# Patient Record
Sex: Female | Born: 1985 | Race: White | Hispanic: No | Marital: Single | Smoking: Current every day smoker
Health system: Southern US, Community
[De-identification: ages and names within clinical notes are randomized; demographics above are authoritative.]

## PROBLEM LIST (undated history)

## (undated) DIAGNOSIS — Z8742 Personal history of other diseases of the female genital tract: Secondary | ICD-10-CM

## (undated) DIAGNOSIS — Z8709 Personal history of other diseases of the respiratory system: Secondary | ICD-10-CM

## (undated) DIAGNOSIS — F419 Anxiety disorder, unspecified: Secondary | ICD-10-CM

## (undated) DIAGNOSIS — R569 Unspecified convulsions: Secondary | ICD-10-CM

## (undated) DIAGNOSIS — Z8619 Personal history of other infectious and parasitic diseases: Secondary | ICD-10-CM

## (undated) DIAGNOSIS — F32A Depression, unspecified: Secondary | ICD-10-CM

## (undated) DIAGNOSIS — Z87440 Personal history of urinary (tract) infections: Secondary | ICD-10-CM

## (undated) HISTORY — PX: FLEXIBLE BRONCHOSCOPY W/ UPPER ENDOSCOPY: SHX1648

## (undated) HISTORY — DX: Personal history of other diseases of the respiratory system: Z87.09

## (undated) HISTORY — DX: Personal history of other infectious and parasitic diseases: Z86.19

## (undated) HISTORY — DX: Depression, unspecified: F32.A

## (undated) HISTORY — DX: Unspecified convulsions: R56.9

## (undated) HISTORY — DX: Personal history of urinary (tract) infections: Z87.440

## (undated) HISTORY — DX: Anxiety disorder, unspecified: F41.9

## (undated) HISTORY — DX: Personal history of other diseases of the female genital tract: Z87.42

## (undated) HISTORY — PX: INNER EAR SURGERY: SHX679

---
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
## 2006-04-25 ENCOUNTER — Observation Stay: Payer: Self-pay

## 2006-05-15 ENCOUNTER — Inpatient Hospital Stay: Payer: Self-pay

## 2008-02-21 ENCOUNTER — Emergency Department: Payer: Self-pay | Admitting: Emergency Medicine

## 2012-04-18 ENCOUNTER — Emergency Department: Payer: Self-pay | Admitting: Emergency Medicine

## 2012-04-18 LAB — DRUG SCREEN, URINE
Amphetamines, Ur Screen: NEGATIVE (ref ?–1000)
Barbiturates, Ur Screen: NEGATIVE (ref ?–200)
Cocaine Metabolite,Ur ~~LOC~~: NEGATIVE (ref ?–300)
MDMA (Ecstasy)Ur Screen: NEGATIVE (ref ?–500)
Methadone, Ur Screen: NEGATIVE (ref ?–300)
Opiate, Ur Screen: NEGATIVE (ref ?–300)
Phencyclidine (PCP) Ur S: NEGATIVE (ref ?–25)
Tricyclic, Ur Screen: NEGATIVE (ref ?–1000)

## 2012-04-18 LAB — COMPREHENSIVE METABOLIC PANEL
Alkaline Phosphatase: 92 U/L (ref 50–136)
Anion Gap: 12 (ref 7–16)
Bilirubin,Total: 0.2 mg/dL (ref 0.2–1.0)
Calcium, Total: 8.7 mg/dL (ref 8.5–10.1)
Co2: 21 mmol/L (ref 21–32)
Creatinine: 0.59 mg/dL — ABNORMAL LOW (ref 0.60–1.30)
EGFR (African American): >60
EGFR (Non-African Amer.): >60
Glucose: 88 mg/dL (ref 65–99)
Potassium: 4.3 mmol/L (ref 3.5–5.1)
Sodium: 146 mmol/L — ABNORMAL HIGH (ref 136–145)
Total Protein: 7.9 g/dL (ref 6.4–8.2)

## 2012-04-18 LAB — ETHANOL
Ethanol %: 0.177 % — ABNORMAL HIGH (ref 0.000–0.080)
Ethanol: 177 mg/dL

## 2012-04-18 LAB — CBC
HCT: 47.9 % — ABNORMAL HIGH (ref 35.0–47.0)
HGB: 16.3 g/dL — ABNORMAL HIGH (ref 12.0–16.0)
MCH: 30.7 pg (ref 26.0–34.0)
MCHC: 34 g/dL (ref 32.0–36.0)
RBC: 5.31 10*6/uL — ABNORMAL HIGH (ref 3.80–5.20)
RDW: 13.5 % (ref 11.5–14.5)
WBC: 8.5 10*3/uL (ref 3.6–11.0)

## 2012-04-18 LAB — SALICYLATE LEVEL: Salicylates, Serum: 3.7 mg/dL — ABNORMAL HIGH

## 2012-04-18 LAB — TSH: Thyroid Stimulating Horm: 1.08 u[IU]/mL

## 2012-04-18 LAB — URINALYSIS, COMPLETE
Bilirubin,UR: NEGATIVE
Blood: NEGATIVE
Glucose,UR: NEGATIVE mg/dL (ref 0–75)
Ketone: NEGATIVE
Leukocyte Esterase: NEGATIVE
Nitrite: NEGATIVE
Ph: 6 (ref 4.5–8.0)
RBC,UR: 1 /HPF (ref 0–5)
Squamous Epithelial: <1
WBC UR: <1 /HPF (ref 0–5)

## 2012-04-18 LAB — ACETAMINOPHEN LEVEL: Acetaminophen: <2 ug/mL

## 2012-04-18 LAB — PREGNANCY, URINE: Pregnancy Test, Urine: NEGATIVE m[IU]/mL

## 2013-05-21 ENCOUNTER — Ambulatory Visit: Payer: Self-pay | Admitting: Physician Assistant

## 2014-02-08 ENCOUNTER — Emergency Department: Payer: Self-pay | Admitting: Emergency Medicine

## 2014-02-08 LAB — URINALYSIS, COMPLETE
BILIRUBIN, UR: NEGATIVE
GLUCOSE, UR: NEGATIVE mg/dL (ref 0–75)
LEUKOCYTE ESTERASE: NEGATIVE
Nitrite: NEGATIVE
Ph: 5 (ref 4.5–8.0)
Protein: NEGATIVE
Specific Gravity: 1.019 (ref 1.003–1.030)
Squamous Epithelial: 6
WBC UR: 4 /HPF (ref 0–5)

## 2014-04-30 ENCOUNTER — Ambulatory Visit: Payer: Self-pay | Admitting: Physician Assistant

## 2014-05-04 ENCOUNTER — Ambulatory Visit: Payer: Self-pay | Admitting: Family Medicine

## 2014-05-18 ENCOUNTER — Ambulatory Visit: Payer: Self-pay | Admitting: Family Medicine

## 2014-07-06 NOTE — Consult Note (Signed)
Brief Consult Note: Diagnosis: Alcohol intoxication, alcohol dependence.   Patient was seen by consultant.   Consult note dictated.   Recommend further assessment or treatment.   Discussed with Attending MD.   Comments: [name] has a h/o alcohol dependence. Last night she got drunk at a bar during [employer] party. She made suicidal threats while drunk and preceeded to taka a bath at home still drinking. She passed out in a bathtub. Her friends took out papers.   She is no longer drunk, suicidal or homicidal. She declines substance abuse treatment and is determined not to drink again.   PLAN: 1. The patient no longer meets criteria for IVC. I will terminate proceedings. Please discharge as appropriate.   2. No medications recommended.   3. [facility].  Electronic Signatures: Kristine LineaPucilowska, Ethelreda Sukhu (MD)  (Signed 03-Feb-14 10:31)  Authored: Brief Consult Note   Last Updated: 03-Feb-14 10:31 by Kristine LineaPucilowska, Josphine Laffey (MD)

## 2014-07-06 NOTE — Consult Note (Signed)
PATIENT NAMMarland Kitchen:  Gardiner BarefootLLMOND, [name] [id number] OF BIRTH:  22-Aug-1985 OF ADMISSION: 02/03/2014OF CONSULTATION:  04/18/2012 PHYSICIAN:  Rabon Scholle B. Jennet MaduroPucilowska, MD REQUESTING PHYSICIAN: Dr. Janalyn Harderavid Kaminski FOR CONSULTATION: To evaluate a suicidal patient.  DATA: [name] is a [age] year old female with a history alcohol abuse.   COMPLAINT: "I am fine." OF PRESENT ILLNESS: [name] was brought to the hospital by the police after her friends took IVC papers out. Reportedly, the patient was at a bar celebrating [employer]. She started drinking liquor which is never a "good idea" for her as she gets "crazy". She made some suicidal statements, continued to drink at home and passed out in a bathtub frightening her friends. She is in the ER completely sober, embarrassed and ready to return to home. She denies any symptoms of depression, anxiety or psychosis. She is a drinker but feels she is OK as long as she does not touch the hard stuff.  She believes that she slowed down her drinking since DUI in the summer. She does not use drugs or pills.  PSYCHIATRIC HISTORY: She reports a period of depression after she lost her grandmother 2 years ago. She did not receive treatment then. She downplays her alcohol use. There were no suicide attempts.  PSYCHIATRIC HISTORY: None reported.   MEDICAL HISTORY: None.  ON ADMISSION: None.   Augmentin, Biaxin, Ceftin, tape.   HISTORY: The patient works at [employer]. She lives with her 28 year old son. Her mother is involved in caring for the child. As above she had a recent DUI but tells me that it has been taken care of.   REVIEW OF SYSTEMS: CONSTITUTIONAL: No fevers or chills. No weigh changes.  EYES:  No double or blurred vision. ENT:  No hearing loss. RESPIRATORY: No shortness of breath or cough. CARDIOVASCULAR:  No chest pain or orthopnea. GASTROINTESTINAL:  No abdominal pain, nausea, vomiting or diarrhea. GENITOURINARY:  No incontinence or frequency. ENDOCRINE:  No heat or cold  intolerance. LYMPHATIC: No anemia or easy bruising. INTEGUMENTARY:  No acne or rash. MUSCULOSKELETAL:  No muscle or joint pain. NEUROLOGIC:  No tingling or weakness. PSYCHIATRIC:  See history of present illness for details.  EXAMINATION: VITAL SIGNS:  Blood pressure 111/68, pulse 94, respirations 20, temperature 98.1. GENERAL: This is a well-developed female in no acute distress. The rest of the physical examination is deferred to her primary attending.  DATA: Chemistries are within normal limits. Blood alcohol level is 0.177. LFTs within normal limits. TSH 1.08. Urine tox screen negative for substances. CBC within normal limits. Urinalysis is not suggestive of urinary tract infection. Serum acetaminophen and salicylates are low.  STATUS EXAMINATION: The patient is alert and oriented to person, place, time and situation. She is pleasant, polite and cooperative. She is marginally groomed and wearing hospital scrubs and a yellow shirt. She maintains good eye contact. Her speech is of normal rhythm, rate and volume. Mood is "fine" with full affect. Thought processing is logical. Thought content, she denies suicidal or homicidal ideation but reportedly made suicidal threats while drunk. There are no delusions or paranoia. There are no auditory or visual hallucinations. Her cognition is grossly intact. Her insight and judgment are fair. RISK ASSESSMENT: This is a patient with a history of alcohol abuse who made suicidal statements while drunk. She is sober and no longer suicidal. There are no symptoms of alcohol withdrawal. She declines substance abuse treatment. She is forward thinking and optimistic about the future.  I:   Alcohol intoxication.  Alcohol abuse. II:   Deferred. III:   Deferred.  IV:   Substance abuse.  V:   GAF 45.     The patient no longer meets criteria for involuntary inpatient psychiatric commitment. I will terminate proceedings. Please discharge as appropriate.    The patient declines  substance abuse rehab participation. She was provided information on substance abuse treatment program in the community.    She was advised to maintain sobriety.   Her friends will pick her up.    Electronic Signatures: Kristine Linea (MD)  (Signed on 04-Feb-14 23:02)  Authored  Last Updated: 04-Feb-14 23:02 by Kristine Linea (MD)

## 2015-05-02 ENCOUNTER — Ambulatory Visit
Admission: EM | Admit: 2015-05-02 | Discharge: 2015-05-02 | Disposition: A | Discharge status: Home / Self Care | Payer: Self-pay | Attending: Family Medicine | Admitting: Family Medicine

## 2015-05-02 DIAGNOSIS — R6889 Other general symptoms and signs: Secondary | ICD-10-CM

## 2015-05-02 DIAGNOSIS — N390 Urinary tract infection, site not specified: Secondary | ICD-10-CM

## 2015-05-02 LAB — URINALYSIS COMPLETE WITH MICROSCOPIC (ARMC ONLY)
Bilirubin Urine: NEGATIVE
Glucose, UA: NEGATIVE mg/dL
HGB URINE DIPSTICK: NEGATIVE
Ketones, ur: NEGATIVE mg/dL
LEUKOCYTES UA: NEGATIVE
NITRITE: NEGATIVE
PH: 7 (ref 5.0–8.0)
PROTEIN: NEGATIVE mg/dL
Specific Gravity, Urine: 1.025 (ref 1.005–1.030)

## 2015-05-02 LAB — RAPID STREP SCREEN (MED CTR MEBANE ONLY): Streptococcus, Group A Screen (Direct): NEGATIVE

## 2015-05-02 LAB — RAPID INFLUENZA A&B ANTIGENS (ARMC ONLY)
INFLUENZA A (ARMC): NOT DETECTED
INFLUENZA B (ARMC): NOT DETECTED

## 2015-05-02 MED ORDER — CIPROFLOXACIN HCL 500 MG PO TABS: 500.0000 mg | ORAL_TABLET | Freq: Two times a day (BID) | ORAL | Status: DC

## 2015-05-02 MED ORDER — FEXOFENADINE-PSEUDOEPHED ER 180-240 MG PO TB24: 1.0000 | ORAL_TABLET | Freq: Every day | ORAL | Status: DC

## 2015-05-02 MED ORDER — MELOXICAM 15 MG PO TABS: 15.0000 mg | ORAL_TABLET | Freq: Every day | ORAL | Status: DC

## 2015-05-02 MED ORDER — ONDANSETRON 8 MG PO TBDP: 8.0000 mg | ORAL_TABLET | Freq: Three times a day (TID) | ORAL | Status: DC | PRN

## 2015-05-02 MED ORDER — PHENAZOPYRIDINE HCL 200 MG PO TABS: 200.0000 mg | ORAL_TABLET | Freq: Three times a day (TID) | ORAL | Status: DC | PRN

## 2015-05-02 MED ORDER — BENZONATATE 200 MG PO CAPS: 200.0000 mg | ORAL_CAPSULE | Freq: Three times a day (TID) | ORAL | Status: DC | PRN

## 2015-05-02 NOTE — ED Notes (Signed)
Flu like symptoms reported for about a week.   Include - diarrhea, vomiting, weakness, etc.  Reports son to be sick, but hasn't been sick to the point of vomiting.

## 2015-05-02 NOTE — ED Notes (Signed)
Non-toxic appearing pt.  Pt just reporting "hurt all over, feels like my eyes are about to bust out of my head".  Multiple flu-like complaints.

## 2015-05-02 NOTE — ED Provider Notes (Signed)
CSN: [id number]     Arrival date & time 05/02/15  1128 History   First MD Initiated Contact with Patient 05/02/15 1334    Nurses notes were reviewed. Chief Complaint  Patient presents with  . Emesis  . Generalized Body Aches  . Diarrhea   Patient's here for miserable. She states her eyeballs hurt. She really says that last week she started having nausea and vomiting and was of defecating all over the place  (defecation my choice of words) doesn't seem to get better but then suddenly on Tuesday she started aching all over feeling horrible having runny nose nasal congestion coughing and fever. She had fever last week as well but that did finally clear up until the fever started again on Tuesday. She is also had a recurrence of vomiting   She also reports some burning urination frequency. She does smoke. She denies any significant family medical history of problems pertinent to this visit that she's had a C-section before.   (Consider location/radiation/quality/duration/timing/severity/associated sxs/prior Treatment) Patient is a 30 y.o. female presenting with vomiting, diarrhea, and URI. The history is provided by the patient. No language interpreter was used.  Emesis Severity:  Moderate Timing:  Constant Quality:  Stomach contents Progression:  Partially resolved Chronicity:  New Associated symptoms: diarrhea, sore throat and URI   Diarrhea Associated symptoms: URI and vomiting   URI Presenting symptoms: congestion, cough, facial pain, rhinorrhea and sore throat   Severity:  Moderate Chronicity:  New Relieved by:  Nothing Worsened by:  Nothing tried Ineffective treatments:  None tried   No past medical history on file. Past Surgical History  Procedure Laterality Date  . Cesarean section     No family history on file. Social History  Substance Use Topics  . Smoking status: Current Every Day Smoker -- 0.50 packs/day    Types: Cigarettes  . Smokeless tobacco: Not on file  .  Alcohol Use: Yes   OB History    No data available     Review of Systems  HENT: Positive for congestion, rhinorrhea and sore throat.   Respiratory: Positive for cough.   Gastrointestinal: Positive for vomiting and diarrhea.  All other systems reviewed and are negative.   Allergies  Augmentin and Biaxin  Home Medications   Prior to Admission medications   Medication Sig Start Date End Date Taking? Authorizing Provider  benzonatate (TESSALON) 200 MG capsule Take 1 capsule (200 mg total) by mouth 3 (three) times daily as needed for cough. 05/02/15   Hassan Rowan, MD  ciprofloxacin (CIPRO) 500 MG tablet Take 1 tablet (500 mg total) by mouth 2 (two) times daily. 05/02/15   Hassan Rowan, MD  fexofenadine-pseudoephedrine (ALLEGRA-D ALLERGY & CONGESTION) 180-240 MG 24 hr tablet Take 1 tablet by mouth daily. 05/02/15   Hassan Rowan, MD  meloxicam (MOBIC) 15 MG tablet Take 1 tablet (15 mg total) by mouth daily. 05/02/15   Hassan Rowan, MD  ondansetron (ZOFRAN ODT) 8 MG disintegrating tablet Take 1 tablet (8 mg total) by mouth every 8 (eight) hours as needed for nausea or vomiting. 05/02/15   Hassan Rowan, MD  phenazopyridine (PYRIDIUM) 200 MG tablet Take 1 tablet (200 mg total) by mouth 3 (three) times daily as needed for pain. 05/02/15   Hassan Rowan, MD   Meds Ordered and Administered this Visit  Medications - No data to display ` BP 117/77 mmHg  Pulse 88  Temp(Src) 97.8 F (36.6 C) (Oral)  Resp 16  SpO2 100% No data found.  Physical Exam  Constitutional: She is oriented to person, place, and time. She appears well-developed and well-nourished.  HENT:  Head: Normocephalic and atraumatic.  Right Ear: Hearing, tympanic membrane, external ear and ear canal normal.  Left Ear: Hearing, tympanic membrane, external ear and ear canal normal.  Nose: Mucosal edema and rhinorrhea present.  Mouth/Throat: Normal dentition. No dental caries.  Eyes: Conjunctivae are normal. Pupils are equal, round, and  reactive to light.  Neck: Normal range of motion. Neck supple. No tracheal deviation present. No thyromegaly present.  Cardiovascular: Normal rate and regular rhythm.   Pulmonary/Chest: Effort normal and breath sounds normal. No respiratory distress.  Abdominal: Soft.  Lymphadenopathy:    She has cervical adenopathy.  Neurological: She is alert and oriented to person, place, and time. No cranial nerve deficit.  Skin: Skin is warm and dry. No erythema.  Psychiatric: She has a normal mood and affect. Her behavior is normal.  Vitals reviewed.   ED Course  Procedures (including critical care time)  Labs Review Labs Reviewed  URINALYSIS COMPLETEWITH MICROSCOPIC ([facility] ONLY) - Abnormal; Notable for the following:    APPearance HAZY (*)    Bacteria, UA RARE (*)    Squamous Epithelial / LPF 6-30 (*)    All other components within normal limits  RAPID STREP SCREEN (NOT AT [facility])  RAPID INFLUENZA A&B ANTIGENS ([facility] ONLY)  URINE CULTURE  CULTURE, GROUP A STREP [facility])    Imaging Review No results found.   Visual Acuity Review  Right Eye Distance:   Left Eye Distance:   Bilateral Distance:    Right Eye Near:   Left Eye Near:    Bilateral Near:    Results for orders placed or performed during the hospital encounter of 05/02/15  Rapid strep screen  Result Value Ref Range   Streptococcus, Group A Screen (Direct) NEGATIVE NEGATIVE  Rapid Influenza A&B Antigens ([facility] only)  Result Value Ref Range   Influenza A ([facility]) NOT DETECTED    Influenza B ([facility]) NOT DETECTED   Urinalysis complete, with microscopic  Result Value Ref Range   Color, Urine YELLOW YELLOW   APPearance HAZY (A) CLEAR   Glucose, UA NEGATIVE NEGATIVE mg/dL   Bilirubin Urine NEGATIVE NEGATIVE   Ketones, ur NEGATIVE NEGATIVE mg/dL   Specific Gravity, Urine 1.025 1.005 - 1.030   Hgb urine dipstick NEGATIVE NEGATIVE   pH 7.0 5.0 - 8.0   Protein, ur NEGATIVE NEGATIVE mg/dL   Nitrite NEGATIVE NEGATIVE   Leukocytes,  UA NEGATIVE NEGATIVE   RBC / HPF 0-5 0 - 5 RBC/hpf   WBC, UA 6-30 0 - 5 WBC/hpf   Bacteria, UA RARE (A) NONE SEEN   Squamous Epithelial / LPF 6-30 (A) NONE SEEN   Mucous PRESENT      MDM   1. Flu-like symptoms   2. UTI (lower urinary tract infection)    We'll treat for UTI with Cipro for about 5 days, will add Pyridium 3 times a day as well. Cranial culture report results whether we continue this treatment. What sounds like flulike symptoms with URI cough and bronchospasm with pharyngitis will treat with Tamiflu 75 mg twice a day Tessalon Perles cough Allegra-D and Mobic for aches and pains. Zofran will be given as well so she still having nausea from her gastroenteritis time from last week to use as needed. Will give a work note for today and tomorrow as well Follow-up PCP in 1 weeks not better      Hassan Rowan,  MD 05/02/15 1536

## 2015-05-02 NOTE — Discharge Instructions (Signed)
Dysuria Dysuria is pain or discomfort while urinating. The pain or discomfort may be felt in the tube that carries urine out of the bladder (urethra) or in the surrounding tissue of the genitals. The pain may also be felt in the groin area, lower abdomen, and lower back. You may have to urinate frequently or have the sudden feeling that you have to urinate (urgency). Dysuria can affect both men and women, but is more common in women. Dysuria can be caused by many different things, including:  Urinary tract infection in women.  Infection of the kidney or bladder.  Kidney stones or bladder stones.  Certain sexually transmitted infections (STIs), such as chlamydia.  Dehydration.  Inflammation of the vagina.  Use of certain medicines.  Use of certain soaps or scented products that cause irritation. HOME CARE INSTRUCTIONS Watch your dysuria for any changes. The following actions may help to reduce any discomfort you are feeling:  Drink enough fluid to keep your urine clear or pale yellow.  Empty your bladder often. Avoid holding urine for long periods of time.  After a bowel movement or urination, women should cleanse from front to back, using each tissue only once.  Empty your bladder after sexual intercourse.  Take medicines only as directed by your health care provider.  If you were prescribed an antibiotic medicine, finish it all even if you start to feel better.  Avoid caffeine, tea, and alcohol. They can irritate the bladder and make dysuria worse. In men, alcohol may irritate the prostate.  Keep all follow-up visits as directed by your health care provider. This is important.  If you had any tests done to find the cause of dysuria, it is your responsibility to obtain your test results. Ask the lab or department performing the test when and how you will get your results. Talk with your health care provider if you have any questions about your results. SEEK MEDICAL CARE  IF:  You develop pain in your back or sides.  You have a fever.  You have nausea or vomiting.  You have blood in your urine.  You are not urinating as often as you usually do. SEEK IMMEDIATE MEDICAL CARE IF:  You pain is severe and not relieved with medicines.  You are unable to hold down any fluids.  You or someone else notices a change in your mental function.  You have a rapid heartbeat at rest.  You have shaking or chills.  You feel extremely weak.   This information is not intended to replace advice given to you by your health care provider. Make sure you discuss any questions you have with your health care provider.   Document Released: 11/29/2003 Document Revised: 03/23/2014 Document Reviewed: 10/26/2013 Elsevier Interactive Patient Education 2016 [employer]. Influenza, Adult Influenza (flu) is an infection in the mouth, nose, and throat (respiratory tract) caused by a virus. The flu can make you feel very ill. Influenza spreads easily from person to person (contagious).  HOME CARE   Only take medicines as told by your doctor.  Use a cool mist humidifier to make breathing easier.  Get plenty of rest until your fever goes away. This usually takes 3 to 4 days.  Drink enough fluids to keep your pee (urine) clear or pale yellow.  Cover your mouth and nose when you cough or sneeze.  Wash your hands well to avoid spreading the flu.  Stay home from work or school until your fever has been gone for at  least 1 full day.  Get a flu shot every year. GET HELP RIGHT AWAY IF:   You have trouble breathing or feel short of breath.  Your skin or nails turn blue.  You have severe neck pain or stiffness.  You have a severe headache, facial pain, or earache.  Your fever gets worse or keeps coming back.  You feel sick to your stomach (nauseous), throw up (vomit), or have watery poop (diarrhea).  You have chest pain.  You have a deep cough that gets worse, or you  cough up more thick spit (mucus). MAKE SURE YOU:   Understand these instructions.  Will watch your condition.  Will get help right away if you are not doing well or get worse.   This information is not intended to replace advice given to you by your health care provider. Make sure you discuss any questions you have with your health care provider.   Document Released: 12/10/2007 Document Revised: 03/23/2014 Document Reviewed: 06/01/2011 Elsevier Interactive Patient Education [employer]

## 2015-05-04 LAB — CULTURE, GROUP A STREP (THRC)

## 2015-05-04 LAB — URINE CULTURE: Special Requests: NORMAL

## 2016-05-28 ENCOUNTER — Encounter: Payer: Self-pay | Admitting: Emergency Medicine

## 2016-05-28 ENCOUNTER — Encounter: Payer: Self-pay | Admitting: Behavioral Health

## 2016-05-28 ENCOUNTER — Inpatient Hospital Stay
Admission: RE | Admit: 2016-05-28 | Discharge: 2016-06-01 | DRG: 897 | Disposition: A | Discharge status: Home / Self Care | Payer: Self-pay | Source: Intra-hospital | Attending: Psychiatry | Admitting: Psychiatry

## 2016-05-28 ENCOUNTER — Emergency Department
Admission: EM | Admit: 2016-05-28 | Discharge: 2016-05-28 | Disposition: A | Discharge status: Other Acute Inpatient Hospital | Payer: Self-pay | Attending: Emergency Medicine | Admitting: Emergency Medicine

## 2016-05-28 DIAGNOSIS — Z818 Family history of other mental and behavioral disorders: Secondary | ICD-10-CM

## 2016-05-28 DIAGNOSIS — F29 Unspecified psychosis not due to a substance or known physiological condition: Secondary | ICD-10-CM | POA: Insufficient documentation

## 2016-05-28 DIAGNOSIS — Z881 Allergy status to other antibiotic agents status: Secondary | ICD-10-CM

## 2016-05-28 DIAGNOSIS — R4587 Impulsiveness: Secondary | ICD-10-CM | POA: Diagnosis present

## 2016-05-28 DIAGNOSIS — F15950 Other stimulant use, unspecified with stimulant-induced psychotic disorder with delusions: Secondary | ICD-10-CM

## 2016-05-28 DIAGNOSIS — Z5181 Encounter for therapeutic drug level monitoring: Secondary | ICD-10-CM | POA: Insufficient documentation

## 2016-05-28 DIAGNOSIS — F15250 Other stimulant dependence with stimulant-induced psychotic disorder with delusions: Principal | ICD-10-CM | POA: Diagnosis present

## 2016-05-28 DIAGNOSIS — F19950 Other psychoactive substance use, unspecified with psychoactive substance-induced psychotic disorder with delusions: Secondary | ICD-10-CM

## 2016-05-28 DIAGNOSIS — F17210 Nicotine dependence, cigarettes, uncomplicated: Secondary | ICD-10-CM | POA: Diagnosis present

## 2016-05-28 DIAGNOSIS — F1220 Cannabis dependence, uncomplicated: Secondary | ICD-10-CM | POA: Diagnosis present

## 2016-05-28 DIAGNOSIS — F17200 Nicotine dependence, unspecified, uncomplicated: Secondary | ICD-10-CM

## 2016-05-28 DIAGNOSIS — F19959 Other psychoactive substance use, unspecified with psychoactive substance-induced psychotic disorder, unspecified: Secondary | ICD-10-CM

## 2016-05-28 DIAGNOSIS — F1520 Other stimulant dependence, uncomplicated: Secondary | ICD-10-CM

## 2016-05-28 DIAGNOSIS — F1290 Cannabis use, unspecified, uncomplicated: Secondary | ICD-10-CM | POA: Insufficient documentation

## 2016-05-28 DIAGNOSIS — E739 Lactose intolerance, unspecified: Secondary | ICD-10-CM | POA: Diagnosis present

## 2016-05-28 LAB — URINALYSIS, COMPLETE (UACMP) WITH MICROSCOPIC
BACTERIA UA: NONE SEEN
BILIRUBIN URINE: NEGATIVE
Glucose, UA: NEGATIVE mg/dL
Hgb urine dipstick: NEGATIVE
KETONES UR: 5 mg/dL — AB
LEUKOCYTES UA: NEGATIVE
Nitrite: NEGATIVE
PROTEIN: NEGATIVE mg/dL
Specific Gravity, Urine: 1.017 (ref 1.005–1.030)
pH: 5 (ref 5.0–8.0)

## 2016-05-28 LAB — COMPREHENSIVE METABOLIC PANEL
ALT: 19 U/L (ref 14–54)
ANION GAP: 11 (ref 5–15)
AST: 25 U/L (ref 15–41)
Albumin: 4.3 g/dL (ref 3.5–5.0)
Alkaline Phosphatase: 59 U/L (ref 38–126)
BUN: 15 mg/dL (ref 6–20)
CALCIUM: 8.8 mg/dL — AB (ref 8.9–10.3)
CO2: 21 mmol/L — AB (ref 22–32)
Chloride: 105 mmol/L (ref 101–111)
Creatinine, Ser: 0.63 mg/dL (ref 0.44–1.00)
GFR calc Af Amer: >60 mL/min (ref >60)
GLUCOSE: 87 mg/dL (ref 65–99)
Potassium: 3.5 mmol/L (ref 3.5–5.1)
Sodium: 137 mmol/L (ref 135–145)
TOTAL PROTEIN: 7.3 g/dL (ref 6.5–8.1)
Total Bilirubin: 0.9 mg/dL (ref 0.3–1.2)

## 2016-05-28 LAB — CBC
HCT: 46.3 % (ref 35.0–47.0)
HEMOGLOBIN: 16 g/dL (ref 12.0–16.0)
MCH: 30.7 pg (ref 26.0–34.0)
MCHC: 34.4 g/dL (ref 32.0–36.0)
MCV: 89 fL (ref 80.0–100.0)
Platelets: 244 10*3/uL (ref 150–440)
RBC: 5.21 MIL/uL — ABNORMAL HIGH (ref 3.80–5.20)
RDW: 13.2 % (ref 11.5–14.5)
WBC: 8.8 10*3/uL (ref 3.6–11.0)

## 2016-05-28 LAB — PREGNANCY, URINE: PREG TEST UR: NEGATIVE

## 2016-05-28 LAB — URINE DRUG SCREEN, QUALITATIVE (ARMC ONLY)
AMPHETAMINES, UR SCREEN: POSITIVE — AB
Barbiturates, Ur Screen: NOT DETECTED
Benzodiazepine, Ur Scrn: POSITIVE — AB
COCAINE METABOLITE, UR ~~LOC~~: NOT DETECTED
Cannabinoid 50 Ng, Ur ~~LOC~~: POSITIVE — AB
MDMA (Ecstasy)Ur Screen: NOT DETECTED
METHADONE SCREEN, URINE: NOT DETECTED
Opiate, Ur Screen: NOT DETECTED
PHENCYCLIDINE (PCP) UR S: NOT DETECTED
Tricyclic, Ur Screen: NOT DETECTED

## 2016-05-28 LAB — ACETAMINOPHEN LEVEL

## 2016-05-28 LAB — ETHANOL: Alcohol, Ethyl (B): 7 mg/dL — ABNORMAL HIGH (ref <5)

## 2016-05-28 LAB — SALICYLATE LEVEL: Salicylate Lvl: <7 mg/dL (ref 2.8–30.0)

## 2016-05-28 MED ORDER — NICOTINE 14 MG/24HR TD PT24
MEDICATED_PATCH | TRANSDERMAL | Status: AC
  Filled 2016-05-28: qty 1

## 2016-05-28 MED ORDER — HALOPERIDOL LACTATE 5 MG/ML IJ SOLN
INTRAMUSCULAR | Status: AC
  Filled 2016-05-28: qty 2

## 2016-05-28 MED ORDER — HALOPERIDOL 5 MG PO TABS
5.0000 mg | ORAL_TABLET | Freq: Once | ORAL | Status: DC
  Filled 2016-05-28: qty 1

## 2016-05-28 MED ORDER — NICOTINE 14 MG/24HR TD PT24
14.0000 mg | MEDICATED_PATCH | Freq: Once | TRANSDERMAL | Status: DC
  Administered 2016-05-28: 14 mg via TRANSDERMAL

## 2016-05-28 MED ORDER — ZIPRASIDONE MESYLATE 20 MG IM SOLR
INTRAMUSCULAR | Status: AC
  Administered 2016-05-28: 20 mg via INTRAMUSCULAR
  Filled 2016-05-28: qty 20

## 2016-05-28 MED ORDER — LORAZEPAM 1 MG PO TABS
1.0000 mg | ORAL_TABLET | Freq: Once | ORAL | Status: AC
  Administered 2016-05-28: 1 mg via ORAL
  Filled 2016-05-28: qty 1

## 2016-05-28 MED ORDER — OLANZAPINE 5 MG PO TBDP
10.0000 mg | ORAL_TABLET | Freq: Two times a day (BID) | ORAL | Status: DC
  Administered 2016-05-28: 10 mg via ORAL
  Filled 2016-05-28: qty 2

## 2016-05-28 MED ORDER — ZIPRASIDONE MESYLATE 20 MG IM SOLR
20.0000 mg | Freq: Once | INTRAMUSCULAR | Status: AC
  Administered 2016-05-28: 20 mg via INTRAMUSCULAR

## 2016-05-28 MED ORDER — OLANZAPINE 5 MG PO TBDP
10.0000 mg | ORAL_TABLET | Freq: Two times a day (BID) | ORAL | Status: DC
  Administered 2016-05-29: 10 mg via ORAL
  Filled 2016-05-28: qty 2

## 2016-05-28 MED ORDER — ACETAMINOPHEN 325 MG PO TABS: 650.0000 mg | ORAL_TABLET | Freq: Four times a day (QID) | ORAL | Status: DC | PRN

## 2016-05-28 MED ORDER — LORAZEPAM 2 MG/ML IJ SOLN
2.0000 mg | Freq: Once | INTRAMUSCULAR | Status: AC
  Administered 2016-05-28: 2 mg via INTRAMUSCULAR
  Filled 2016-05-28: qty 1

## 2016-05-28 MED ORDER — DIPHENHYDRAMINE HCL 50 MG/ML IJ SOLN
INTRAMUSCULAR | Status: AC
  Filled 2016-05-28: qty 1

## 2016-05-28 MED ORDER — ALUM & MAG HYDROXIDE-SIMETH 200-200-20 MG/5ML PO SUSP: 30.0000 mL | ORAL | Status: DC | PRN

## 2016-05-28 MED ORDER — HALOPERIDOL LACTATE 5 MG/ML IJ SOLN
INTRAMUSCULAR | Status: AC
  Filled 2016-05-28: qty 1

## 2016-05-28 MED ORDER — LORAZEPAM 2 MG/ML IJ SOLN
INTRAMUSCULAR | Status: AC
  Filled 2016-05-28: qty 1

## 2016-05-28 MED ORDER — MAGNESIUM HYDROXIDE 400 MG/5ML PO SUSP: 30.0000 mL | Freq: Every day | ORAL | Status: DC | PRN

## 2016-05-28 NOTE — ED Notes (Signed)
[facility], pt cooperative at times, paranoid behavior observed, pt standing in room pacing

## 2016-05-28 NOTE — ED Notes (Signed)
Pt sleeping form IM medications earlier, Zyprexa held, Dr.Schaevitz notified
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
## 2016-05-28 NOTE — ED Notes (Signed)
Pt sleeping in bed
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
## 2016-05-28 NOTE — ED Provider Notes (Signed)
 -----------------------------------------   1:51 PM on 05/28/2016 -----------------------------------------  Case is discussed with Dr. Toni Amendlapacs after his evaluation of the patient in the emergency department. There is a polysubstance abuse element, but he is also concerned about psychosis and anticipates the patient will likely require psychiatric admission. Remains medically stable for now.   Sharman CheekPhillip Clevland Cork, MD 05/28/16 1351
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
## 2016-05-29 ENCOUNTER — Encounter: Payer: Self-pay | Admitting: Psychiatry

## 2016-05-29 DIAGNOSIS — F1220 Cannabis dependence, uncomplicated: Secondary | ICD-10-CM

## 2016-05-29 DIAGNOSIS — F29 Unspecified psychosis not due to a substance or known physiological condition: Secondary | ICD-10-CM

## 2016-05-29 DIAGNOSIS — F1520 Other stimulant dependence, uncomplicated: Secondary | ICD-10-CM

## 2016-05-29 DIAGNOSIS — F17200 Nicotine dependence, unspecified, uncomplicated: Secondary | ICD-10-CM

## 2016-05-29 LAB — LIPID PANEL
CHOLESTEROL: 192 mg/dL (ref 0–200)
HDL: 50 mg/dL (ref >40)
LDL CALC: 126 mg/dL — AB (ref 0–99)
TRIGLYCERIDES: 78 mg/dL (ref <150)
Total CHOL/HDL Ratio: 3.8 RATIO
VLDL: 16 mg/dL (ref 0–40)

## 2016-05-29 LAB — TSH: TSH: 3.28 u[IU]/mL (ref 0.350–4.500)

## 2016-05-29 MED ORDER — NICOTINE 21 MG/24HR TD PT24
21.0000 mg | MEDICATED_PATCH | Freq: Every day | TRANSDERMAL | Status: DC
  Administered 2016-05-29 – 2016-06-01 (×4): 21 mg via TRANSDERMAL
  Filled 2016-05-29 (×3): qty 1

## 2016-05-29 MED ORDER — OLANZAPINE 5 MG PO TBDP
7.5000 mg | ORAL_TABLET | Freq: Two times a day (BID) | ORAL | Status: DC
  Administered 2016-05-29 – 2016-06-01 (×6): 7.5 mg via ORAL
  Filled 2016-05-29: qty 2
  Filled 2016-05-29: qty 1
  Filled 2016-05-29 (×5): qty 2

## 2016-05-29 NOTE — Progress Notes (Signed)
Recreation Therapy Notes  Date: 03.16.18 Time: 9:30 am Location: Craft Room  Group Topic: Coping Skills  Goal Area(s) Addresses:  Patient will participate in healthy coping skill activity. Patient will verbalize benefit of using art as a coping skill.  Behavioral Response: Attentive, Left early  Intervention: Coloring  Activity: Patients were given coloring sheets to color and were instructed to think about the emotions they were feeling and what their minds were focused on.  Education: LRT educated patients on healthy coping skills.  Education Outcome: Patient left before LRT educated group.   Clinical Observations/Feedback: Patient colored coloring sheet. Patient giggled at peer when she left. LRT redirect patient. Patient complied. When peer returned, patient left group at approximately 9:43 am and apologized. Patient did not return to group.  Jacquelynn CreeGreene,Keymani Mclean M, LRT/CTRS 05/29/2016 10:31 AM

## 2016-05-29 NOTE — [facility] Group Notes (Signed)
[facility] LCSW Group Therapy Note  Date/Time: 05/29/16, 1300  Type of Therapy and Topic:  Group Therapy:  Feelings around Relapse and Recovery  Participation Level:  Did Not Attend   Mood:  Description of Group:    Patients in this group will discuss emotions they experience before and after a relapse. They will process how experiencing these feelings, or avoidance of experiencing them, relates to having a relapse. Facilitator will guide patients to explore emotions they have related to recovery. Patients will be encouraged to process which emotions are more powerful. They will be guided to discuss the emotional reaction significant others in their lives may have to patients' relapse or recovery. Patients will be assisted in exploring ways to respond to the emotions of others without this contributing to a relapse.  Therapeutic Goals: 1. Patient will identify two or more emotions that lead to relapse for them:  2. Patient will identify two emotions that result when they relapse:  3. Patient will identify two emotions related to recovery:  4. Patient will demonstrate ability to communicate their needs through discussion and/or role plays.   Summary of Patient Progress:     Therapeutic Modalities:   Cognitive Behavioral Therapy Solution-Focused Therapy Assertiveness Training Relapse Prevention Therapy  Greg Issai Werling, LCSW       

## 2016-05-29 NOTE — Progress Notes (Signed)
Recreation Therapy Notes  INPATIENT RECREATION THERAPY ASSESSMENT  Patient Details Name: Gwendolyn Gonzales MRN: [id number] DOB: 1985/11/02 Today's Date: 05/29/2016  Patient Stressors:  Patient verbalized no stressors.  Coping Skills:   Exercise, Art/Dance, Talking, Music, Other (Comment) (Clean)  Personal Challenges:  Patient verbalized no personal challenges.  Leisure Interests (2+):  Music - Listen, Technical brewerature - [employer] streamer[employer]unting  Awareness of Community Resources:  Yes  Community Resources:  [employer], [facility]  Current Use: No  If no, Barriers?: Other (Comment) (Time)  Patient Strengths:  Smile, positive, loves music  Patient Identified Areas of Improvement:  Nope  Current Recreation Participation:  Facebook - helping other people's problems  Patient Goal for Hospitalization:  To get out and stay on track  [place]  County of Residence:  [place]   Current SI (including self-harm):  No  Current HI:  No  Consent to Intern Participation: N/A  Due to patient reporting no personal challenges, LRT will not develop a Recreational Therapy Care Plan at this time. If patient's status changes, LRT will develop a Recreational Therapy Care Plan.  Jacquelynn CreeGreene,Liahna Brickner M, LRT/CTRS  05/29/2016, 3:26 PM

## 2016-05-29 NOTE — [facility] Counselor (Signed)
Adult Comprehensive Assessment  Patient ID: Gwendolyn Gonzales, female   DOB: [birth year], 31 y.o.   MRN: [id number]  Information Source: Information source: Patient  Current Stressors:  Employment / Job issues: supposed to start a job yesterday. Substance abuse: relapsed on meth 2 nights ago.  Had been clean for 2 months.   Living/Environment/Situation:  Living Arrangements: Spouse/significant other (renting apartment) Living conditions (as described by patient or guardian): very positive How long has patient lived in current situation?: 2 months What is atmosphere in current home: Comfortable, Loving  Family History:  Marital status: Single Are you sexually active?: Yes What is your sexual orientation?: heterosexual Does patient have children?: Yes How many children?: 2 How is patient's relationship with their children?: 52 year old boy, 12 year old girl.  "Perfect" relationship  Childhood History:  By whom was/is the patient raised?: Grandparents Additional childhood history information: mom was in and out of pt's life, pt always lived with grandmother.  Dad-never met her father. Description of patient's relationship with caregiver when they were a child: PErfect.  OK with mom. Patient's description of current relationship with people who raised him/her: Father is deceased.  Good relationship with mother.  Grandmother died 05/22/10. How were you disciplined when you got in trouble as a child/adolescent?: physical discipline, appropriate. Does patient have siblings?: Yes Number of Siblings: 7 Description of patient's current relationship with siblings: 2 sisters, 4 brothers.  Only communicate by facebook.  They are local and pt gets along with them fine. Did patient suffer any verbal/emotional/physical/sexual abuse as a child?: No Did patient suffer from severe childhood neglect?: No Has patient ever been sexually abused/assaulted/raped as an adolescent or adult?: No Was the  patient ever a victim of a crime or a disaster?: No Witnessed domestic violence?: No Has patient been effected by domestic violence as an adult?: No  Education:  Highest grade of school patient has completed: HS diploma Currently a Ship broker?: No Learning disability?: No  Employment/Work Situation:   Employment situation: Unemployed (supposed to start a new job) Patient's job has been impacted by current illness: No What is the longest time patient has a held a job?: 10 years Where was the patient employed at that time?: [employer] Has patient ever been in the [employer]?: No Are There Guns or Other Weapons in [place]?: No  Financial Resources:   Financial resources: Income from spouse Does patient have a representative payee or guardian?: No  Alcohol/Substance Abuse:   What has been your use of drugs/alcohol within the last 12 months?: Denies alcohol.  Meth: only used meth this one time.  Denies regular use of marijana, did smoke recently once. If attempted suicide, did drugs/alcohol play a role in this?: No Alcohol/Substance Abuse Treatment Hx: Denies past history Has alcohol/substance abuse ever caused legal problems?: No  Social Support System:   Patient's Community Support System: Good Describe Community Support System: family, 3 friends Type of faith/religion: na How does patient's faith help to cope with current illness?: na  Leisure/Recreation:   Leisure and Hobbies: watch TV  Strengths/Needs:   What things does the patient do well?: finding things to stay busy, not being idle In what areas does patient struggle / problems for patient: bad decision on meth, idle time  Discharge Plan:   Does patient have access to transportation?: Yes Will patient be returning to same living situation after discharge?: Yes Currently receiving community mental health services: No If no, would patient like referral for services when  discharged?: Yes (What county?) Sports coach) Does  patient have financial barriers related to discharge medications?: Yes Patient description of barriers related to discharge medications: No insurance.  Summary/Recommendations:   Summary and Recommendations (to be completed by the evaluator): Pt is 31 year old female from [place]. [facility])  Pt diagnosed with methamphetamine and THC use disorder, and schizophrenia spectrum disorder and admitted after experiencing psychosis. Recommendations for pt include crisis stabilization, therapeutic milieu, attend and participate in groups, medication management, and development of comprehensive mental wellness and substance use recover plan.  Gwendolyn Gonzales. 05/29/2016
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
## 2016-05-29 NOTE — Tx Team (Signed)
Interdisciplinary Treatment and Diagnostic Plan Update  05/29/2016 Time of Session: 11:00am Gwendolyn Gonzales MRN: [id number]  Principal Diagnosis: Schizophrenia spectrum disorder with psychotic disorder type not yet determined  Secondary Diagnoses: Principal Problem:   Schizophrenia spectrum disorder with psychotic disorder type not yet determined Active Problems:   Methamphetamine use disorder, severe (HCC)   Tobacco use disorder   Cannabis use disorder, moderate, dependence (HCC)   Current Medications:  Current Facility-Administered Medications  Medication Dose Route Frequency Provider Last Rate Last Dose  . acetaminophen (TYLENOL) tablet 650 mg  650 mg Oral Q6H PRN Audery Amel, MD      . alum & mag hydroxide-simeth (MAALOX/MYLANTA) 200-200-20 MG/5ML suspension 30 mL  30 mL Oral Q4H PRN Audery Amel, MD      . magnesium hydroxide (MILK OF MAGNESIA) suspension 30 mL  30 mL Oral Daily PRN Audery Amel, MD      . nicotine (NICODERM CQ - dosed in mg/24 hours) patch 21 mg  21 mg Transdermal Daily Jimmy Footman, MD   21 mg at 05/29/16 1015  . OLANZapine zydis (ZYPREXA) disintegrating tablet 7.5 mg  7.5 mg Oral BID AC Jimmy Footman, MD       PTA Medications: Prescriptions Prior to Admission  Medication Sig Dispense Refill Last Dose  . benzonatate (TESSALON) 200 MG capsule Take 1 capsule (200 mg total) by mouth 3 (three) times daily as needed for cough. 20 capsule 1   . ciprofloxacin (CIPRO) 500 MG tablet Take 1 tablet (500 mg total) by mouth 2 (two) times daily. 10 tablet 0   . fexofenadine-pseudoephedrine (ALLEGRA-D ALLERGY & CONGESTION) 180-240 MG 24 hr tablet Take 1 tablet by mouth daily. 30 tablet 0   . meloxicam (MOBIC) 15 MG tablet Take 1 tablet (15 mg total) by mouth daily. 30 tablet 0   . ondansetron (ZOFRAN ODT) 8 MG disintegrating tablet Take 1 tablet (8 mg total) by mouth every 8 (eight) hours as needed for nausea or vomiting. 12 tablet 0    . phenazopyridine (PYRIDIUM) 200 MG tablet Take 1 tablet (200 mg total) by mouth 3 (three) times daily as needed for pain. 15 tablet 0     Patient Stressors:    Patient Strengths:    Treatment Modalities: Medication Management, Group therapy, Case management,  1 to 1 session with clinician, Psychoeducation, Recreational therapy.   Physician Treatment Plan for Primary Diagnosis: Schizophrenia spectrum disorder with psychotic disorder type not yet determined Long Term Goal(s): Improvement in symptoms so as ready for discharge Improvement in symptoms so as ready for discharge   Short Term Goals: Ability to identify changes in lifestyle to reduce recurrence of condition will improve Ability to demonstrate self-control will improve Ability to identify triggers associated with substance abuse/mental health issues will improve Ability to identify and develop effective coping behaviors will improve  Medication Management: Evaluate patient's response, side effects, and tolerance of medication regimen.  Therapeutic Interventions: 1 to 1 sessions, Unit Group sessions and Medication administration.  Evaluation of Outcomes: Progressing  Physician Treatment Plan for Secondary Diagnosis: Principal Problem:   Schizophrenia spectrum disorder with psychotic disorder type not yet determined Active Problems:   Methamphetamine use disorder, severe (HCC)   Tobacco use disorder   Cannabis use disorder, moderate, dependence (HCC)  Long Term Goal(s): Improvement in symptoms so as ready for discharge Improvement in symptoms so as ready for discharge   Short Term Goals: Ability to identify changes in lifestyle to reduce recurrence of condition will improve  Ability to demonstrate self-control will improve Ability to identify triggers associated with substance abuse/mental health issues will improve Ability to identify and develop effective coping behaviors will improve     Medication Management:  Evaluate patient's response, side effects, and tolerance of medication regimen.  Therapeutic Interventions: 1 to 1 sessions, Unit Group sessions and Medication administration.  Evaluation of Outcomes: Progressing   RN Treatment Plan for Primary Diagnosis: Schizophrenia spectrum disorder with psychotic disorder type not yet determined Long Term Goal(s): Knowledge of disease and therapeutic regimen to maintain health will improve  Short Term Goals: Ability to verbalize feelings will improve, Ability to identify and develop effective coping behaviors will improve and Compliance with prescribed medications will improve  Medication Management: RN will administer medications as ordered by provider, will assess and evaluate patient's response and provide education to patient for prescribed medication. RN will report any adverse and/or side effects to prescribing provider.  Therapeutic Interventions: 1 on 1 counseling sessions, Psychoeducation, Medication administration, Evaluate responses to treatment, Monitor vital signs and CBGs as ordered, Perform/monitor CIWA, COWS, AIMS and Fall Risk screenings as ordered, Perform wound care treatments as ordered.  Evaluation of Outcomes: Progressing   LCSW Treatment Plan for Primary Diagnosis: Schizophrenia spectrum disorder with psychotic disorder type not yet determined Long Term Goal(s): Safe transition to appropriate next level of care at discharge, Engage patient in therapeutic group addressing interpersonal concerns.  Short Term Goals: Engage patient in aftercare planning with referrals and resources and Increase ability to appropriately verbalize feelings  Therapeutic Interventions: Assess for all discharge needs, 1 to 1 time with Social worker, Explore available resources and support systems, Assess for adequacy in community support network, Educate family and significant other(s) on suicide prevention, Complete Psychosocial Assessment, Interpersonal  group therapy.  Evaluation of Outcomes: Progressing   Progress in Treatment: Attending groups: No. Participating in groups: No. Taking medication as prescribed: Yes. Toleration medication: Yes. Family/Significant other contact made: No, will contact:  patient's boyfriend Patient understands diagnosis: Yes. Discussing patient identified problems/goals with staff: Yes. Medical problems stabilized or resolved: Yes. Denies suicidal/homicidal ideation: Yes. Issues/concerns per patient self-inventory: No. Other: n/a  New problem(s) identified: None identified at this time.   New Short Term/Long Term Goal(s): Goals identified by patient during treatment team: "I want to talk about my feelings and stay positive".   Discharge Plan or Barriers: Patient will discharge with outpatient services for medication management/outpatient therapy.   Reason for Continuation of Hospitalization: Mania Medication stabilization  Estimated Length of Stay: 3 to 5 days.   Attendees: Patient: Larene PickettStephanie [name] Carda 05/29/2016 2:10 PM  Physician: Dr. Radene JourneyAndrea HernandezJayme Cloud- Gonzalez, MD 05/29/2016 2:10 PM  Nursing: Leonia ReaderPhyllis Cobb, RN 05/29/2016 2:10 PM  RN Care Manager: 05/29/2016 2:10 PM  Social Worker: Fredrich BirksAmaris G. Garnette CzechSampson MSW, LCSWA 05/29/2016 2:10 PM  Recreational Therapist: Jacquelynn CreeElizabeth M. Greene, LRT/CTRS 05/29/2016 2:10 PM  Other:  05/29/2016 2:10 PM  Other:  05/29/2016 2:10 PM  Other: 05/29/2016 2:10 PM    Scribe for Treatment Team: Arelia LongestAmaris G Dalasia Predmore, LCSWA 05/29/2016 2:13 PM

## 2016-05-30 LAB — PROLACTIN: Prolactin: 34.9 ng/mL — ABNORMAL HIGH (ref 4.8–23.3)

## 2016-05-30 LAB — HEMOGLOBIN A1C
HEMOGLOBIN A1C: 5 % (ref 4.8–5.6)
MEAN PLASMA GLUCOSE: 97 mg/dL

## 2016-05-30 MED ORDER — WHITE PETROLATUM GEL: Status: DC | PRN

## 2016-05-30 NOTE — [facility] Group Notes (Signed)
[facility] LCSW Group Therapy  05/30/2016 2:43 PM  Type of Therapy:  Group Therapy  Participation Level:  Active  Participation Quality:  Attentive  Affect:  Appropriate  Cognitive:  Alert  Insight:  Improving  Engagement in Therapy:  Engaged  Modes of Intervention:  Discussion, Education, Dance movement psychotherapisteality Testing, Socialization and Support  Summary of Progress/Problems: Goal Setting: The objective is to set goals as they relate to the crisis in which they were admitted. Patients will be using SMART goal modalities to set measurable goals. Characteristics of realistic goals will be discussed and patients will be assisted in setting and processing how one will reach their goal. Facilitator will also assist patients in applying interventions and coping skills learned in psycho-education groups to the SMART goal and process how one will achieve defined goal.  Gwendolyn Gonzales G. Garnette CzechSampson MSW, LCSWA 05/30/2016, 2:44 PM

## 2016-05-30 NOTE — Plan of Care (Signed)
Problem: Education: Goal: Mental status will improve Outcome: Progressing Pt thoughts more organized and coherent today. Pt voices she can tell her thoughts are better today. Attends groups. Awake and alert on unit and appropriately interacts with staff/peers.

## 2016-05-30 NOTE — Progress Notes (Signed)
Pt awake, alert, oriented and up on unit today. Pleasant and cooperative. Appropriately interacts with staff/peers. Attends and participates in groups. Appears brighter with thoughts more organized than yesterday. Pt requests discharge stating, "I need to start a job in the morning. I have a plan, and I promise I'm going to stick to it. I'm going to stay away from anyone I've ever associated with that uses drugs." Pt is observed in her room working unit provided workbook to develop coping skills and journal. Pt denies SI/HI/AVH. Showered this morning. Pt is medication compliant. Reports good sleep last night without the help of sleep medication, good appetite, high energy, good concentration. Rates depression, anxiety, hopelessness 0/10 (low 0-10 high). Goal today is "focusing on maintaining progress of recovery, start my job at [employer] as soon as I get out and to stay wth the plan of not going around the temptation to fail" by "stay positive and clean while obtaining ultimate goal to recovery." Support and encouragement provided. Medications administered as ordered with education. Safety maintained with every 15 minute checks. Will continue to monitor.

## 2016-05-30 NOTE — Progress Notes (Signed)
[facility] MD Progress Note  05/30/2016 12:30 PM Gwendolyn Gonzales  MRN:  [id number] Subjective:  Patient seen today for follow-up treatment in the hospital. She was awake alert and cooperative. She reported she is feeling much better. She says that her thoughts are clearer. She was able to sleep well last night. She says that now that she has slept it off and got farther away from drugs she is doing much better. Denies any hallucinations. Denies any current suicidal ideation or homicidal ideation. Very appropriate in her interactions. Didn't have any specific physical complaints. Principal Problem: Schizophrenia spectrum disorder with psychotic disorder type not yet determined Diagnosis:   Patient Active Problem List   Diagnosis Date Noted  . 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 Schizophrenia spectrum disorder with psychotic disorder type not yet determined [F29] 05/29/2016  . Tobacco use disorder [F17.200] 05/29/2016  . Cannabis use disorder, moderate, dependence (HCC) [F12.20] 05/29/2016   Total Time spent with patient: 20 minutes  Past Psychiatric History: Patient has a long history of substance abuse issues previously with mostly alcohol although recently has been involved with methamphetamine. No prior history of diagnosed psychotic disorder.  Past Medical History: History reviewed. No pertinent past medical history.  Past Surgical History:  Procedure Laterality Date  . CESAREAN SECTION    . CESAREAN SECTION  15 May 2006  . FLEXIBLE BRONCHOSCOPY W/ UPPER ENDOSCOPY    . INNER EAR SURGERY     Family History: History reviewed. No pertinent family history. Family Psychiatric  History: Positive for mild depression Social History:  History  Alcohol Use  . 0.6 oz/week  . 1 Cans of beer per week    Comment: half a beer a day     History  Drug Use  . Types: Methamphetamines, Marijuana    Comment: patient states, "I've been off of it a month and a half"     Social History   Social History  . Marital status: Single    Spouse name: N/A  . Number of children: N/A  . Years of education: N/A   Social History Main Topics  . Smoking status: Current Every Day Smoker    Packs/day: 0.50    Types: Cigarettes  . Smokeless tobacco: Never Used  . Alcohol use 0.6 oz/week    1 Cans of beer per week     Comment: half a beer a day  . Drug use: Yes    Types: Methamphetamines, Marijuana     Comment: patient states, "I've been off of it a month and a half"  . Sexual activity: Yes    Birth control/ protection: None   Other Topics Concern  . None   Social History Narrative  . None   Additional Social History:    History of alcohol / drug use?: Yes Negative Consequences of Use: Personal relationships Withdrawal Symptoms: Agitation, Irritability                    Sleep: Fair  Appetite:  Good  Current Medications: Current Facility-Administered Medications  Medication Dose Route Frequency Provider Last Rate Last Dose  . acetaminophen (TYLENOL) tablet 650 mg  650 mg Oral Q6H PRN Audery Amel, MD      . alum & mag hydroxide-simeth (MAALOX/MYLANTA) 200-200-20 MG/5ML suspension 30 mL  30 mL Oral Q4H PRN Audery Amel, MD      . magnesium hydroxide (MILK OF MAGNESIA) suspension 30 mL  30 mL Oral Daily PRN Jonny Ruiz T  Clapacs, MD      . nicotine (NICODERM CQ - dosed in mg/24 hours) patch 21 mg  21 mg Transdermal Daily Jimmy Footman, MD   21 mg at 05/30/16 0847  . OLANZapine zydis (ZYPREXA) disintegrating tablet 7.5 mg  7.5 mg Oral BID AC Jimmy Footman, MD   7.5 mg at 05/30/16 0844  . white petrolatum (VASELINE) gel   Topical PRN Audery Amel, MD        Lab Results:  Results for orders placed or performed during the hospital encounter of 05/28/16 (from the past 48 hour(s))  Hemoglobin A1c     Status: None   Collection Time: 05/29/16  6:51 AM  Result Value Ref Range   Hgb A1c MFr Bld 5.0 4.8 - 5.6 %    Comment:  (NOTE)         Pre-diabetes: 5.7 - 6.4         Diabetes: >6.4         Glycemic control for adults with diabetes: <7.0    Mean Plasma Glucose 97 mg/dL    Comment: (NOTE) Performed At: [facility] [address] [place], [place] [id number] Mila Homer MD WU:[id number]   Lipid panel     Status: Abnormal   Collection Time: 05/29/16  6:51 AM  Result Value Ref Range   Cholesterol 192 0 - 200 mg/dL   Triglycerides 78 <956 mg/dL   HDL 50 >21 mg/dL   Total CHOL/HDL Ratio 3.8 RATIO   VLDL 16 0 - 40 mg/dL   LDL Cholesterol 308 (H) 0 - 99 mg/dL    Comment:        Total Cholesterol/HDL:CHD Risk Coronary Heart Disease Risk Table                     Men   Women  1/2 Average Risk   3.4   3.3  Average Risk       5.0   4.4  2 X Average Risk   9.6   7.1  3 X Average Risk  23.4   11.0        Use the calculated Patient Ratio above and the CHD Risk Table to determine the patient's CHD Risk.        ATP III CLASSIFICATION (LDL):  <100     mg/dL   Optimal  657-846  mg/dL   Near or Above                    Optimal  130-159  mg/dL   Borderline  962-952  mg/dL   High  >841     mg/dL   Very High   Prolactin     Status: Abnormal   Collection Time: 05/29/16  6:51 AM  Result Value Ref Range   Prolactin 34.9 (H) 4.8 - 23.3 ng/mL    Comment: (NOTE) Performed At: [facility] [address] [place], [place] [id number] Mila Homer MD OZ:[id number]   TSH     Status: None   Collection Time: 05/29/16  6:51 AM  Result Value Ref Range   TSH 3.280 0.350 - 4.500 uIU/mL    Comment: Performed by a 3rd Generation assay with a functional sensitivity of <=0.01 uIU/mL.    Blood Alcohol level:  Lab Results  Component Value Date   ETH 7 (H) 05/28/2016    Metabolic Disorder Labs: Lab Results  Component Value Date   HGBA1C 5.0 05/29/2016   MPG 97 05/29/2016  Lab Results  Component Value Date   PROLACTIN 34.9 (H) 05/29/2016   Lab Results  Component Value Date   CHOL  192 05/29/2016   TRIG 78 05/29/2016   HDL 50 05/29/2016   CHOLHDL 3.8 05/29/2016   VLDL 16 05/29/2016   LDLCALC 126 (H) 05/29/2016    Physical Findings: AIMS: Facial and Oral Movements Muscles of Facial Expression: None, normal Lips and Perioral Area: None, normal Jaw: None, normal Tongue: None, normal,Extremity Movements Upper (arms, wrists, hands, fingers): None, normal Lower (legs, knees, ankles, toes): None, normal, Trunk Movements Neck, shoulders, hips: None, normal, Overall Severity Severity of abnormal movements (highest score from questions above): None, normal Incapacitation due to abnormal movements: None, normal Patient's awareness of abnormal movements (rate only patient's report): No Awareness, Dental Status Current problems with teeth and/or dentures?: No Does patient usually wear dentures?: No  CIWA:  CIWA-Ar Total: 4 COWS:     Musculoskeletal: Strength & Muscle Tone: within normal limits Gait & Station: normal Patient leans: N/A  Psychiatric Specialty Exam: Physical Exam  Nursing note and vitals reviewed. Constitutional: She appears well-developed and well-nourished.  HENT:  Head: Normocephalic and atraumatic.  Eyes: Conjunctivae are normal. Pupils are equal, round, and reactive to light.  Neck: Normal range of motion.  Cardiovascular: Regular rhythm and normal heart sounds.   Respiratory: Effort normal. No respiratory distress.  GI: Soft.  Musculoskeletal: Normal range of motion.  Neurological: She is alert.  Skin: Skin is warm and dry.  Psychiatric: She has a normal mood and affect. Her speech is normal and behavior is normal. Thought content is not paranoid. She expresses impulsivity. She expresses no homicidal and no suicidal ideation. She exhibits abnormal recent memory.    Review of Systems  Constitutional: Negative.   HENT: Negative.   Eyes: Negative.   Respiratory: Negative.   Cardiovascular: Negative.   Gastrointestinal: Negative.    Musculoskeletal: Negative.   Skin: Negative.   Neurological: Negative.   Psychiatric/Behavioral: Positive for memory loss and substance abuse. Negative for depression, hallucinations and suicidal ideas. The patient is nervous/anxious. The patient does not have insomnia.     Blood pressure 103/70, pulse 85, temperature 97.9 F (36.6 C), temperature source Oral, resp. rate 18, height 5' (1.524 m), weight 58.5 kg (129 lb), last menstrual period 05/24/2016.Body mass index is 25.19 kg/m.  General Appearance: Casual  Eye Contact:  Good  Speech:  Normal Rate  Volume:  Normal  Mood:  Anxious  Affect:  Appropriate  Thought Process:  Goal Directed  Orientation:  Full (Time, Place, and Person)  Thought Content:  Logical  Suicidal Thoughts:  No  Homicidal Thoughts:  No  Memory:  Immediate;   Fair Recent;   Fair Remote;   Fair  Judgement:  Fair  Insight:  Fair  Psychomotor Activity:  Normal  Concentration:  Concentration: Fair  Recall:  Fair  Fund of Knowledge:  Good  Language:  Good  Akathisia:  No  Handed:  Right  AIMS (if indicated):     Assets:  Communication Skills Desire for Improvement Housing Physical Health Resilience  ADL's:  Intact  Cognition:  WNL  Sleep:  Number of Hours: 8.5     Treatment Plan Summary: Daily contact with patient to assess and evaluate symptoms and progress in treatment, Medication management and Plan Patient is significantly improved from admission. For range of mood and affect. Improved insight. Looks like more likely to be related to drug abuse. Tolerating medicine well. Supportive counseling and review of  outpatient substance abuse treatment with patient. Continue medicine for now. No further change to medicine. Follow-up over the next day or 2.  Mordecai RasmussenJohn Clapacs, MD 05/30/2016, 12:30 PM

## 2016-05-30 NOTE — Progress Notes (Signed)
D: Pt denies SI/HI/AVH. Pt is irritable, not willingly to participate in treatment plan. Pt appears less anxious and is not interacting with peers and staff. Patient isolated in room did not come out of the room.  A: Pt was offered support and encouragement. Pt was given scheduled medications. Pt was encouraged to attend groups. Q 15 minute checks were done for safety.  R:Pt did not attend group.Pt refused medication. Pt has no complaints.Pt is not receptive to treatment. Safety maintained on unit.

## 2016-05-31 NOTE — [facility] Group Notes (Signed)
[facility] Group Notes:  (Nursing/MHT/Case Management/Adjunct)  Date:  05/31/2016  Time:  12:31 AM  Type of Therapy:  Group Therapy  Participation Level:  Did Not Attend    Veva Holesshley Imani Bingham [place] 05/31/2016, 12:31 AM

## 2016-05-31 NOTE — Progress Notes (Signed)
[facility] MD Progress Note  05/31/2016 1:40 PM Gwendolyn Gonzales  MRN:  [id number] Subjective:  Patient seen today for follow-up treatment in the hospital. She was awake alert and cooperative. She reported she is feeling much better. She says that her thoughts are clearer. She was able to sleep well last night. She says that now that she has slept it off and got farther away from drugs she is doing much better. Denies any hallucinations. Denies any current suicidal ideation or homicidal ideation. Very appropriate in her interactions. Didn't have any specific physical complaints.  Follow-up for Sunday the 18th. Patient continues to state she is feeling better. She says she thinks her current medicine is really helping and wants to continue on it after discharge. Patient appears to be lucid. Less hyper and agitated today than she even was yesterday. Alert and oriented. Taking care of hygiene well. He is like she is getting close to being ready for discharge. Principal Problem: Schizophrenia spectrum disorder with psychotic disorder type not yet determined Diagnosis:   Patient Active Problem List   Diagnosis Date Noted  . 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 Schizophrenia spectrum disorder with psychotic disorder type not yet determined [F29] 05/29/2016  . Tobacco use disorder [F17.200] 05/29/2016  . Cannabis use disorder, moderate, dependence (HCC) [F12.20] 05/29/2016   Total Time spent with patient: 20 minutes  Past Psychiatric History: Patient has a long history of substance abuse issues previously with mostly alcohol although recently has been involved with methamphetamine. No prior history of diagnosed psychotic disorder.  Past Medical History: History reviewed. No pertinent past medical history.  Past Surgical History:  Procedure Laterality Date  . CESAREAN SECTION    . CESAREAN SECTION  15 May 2006  . FLEXIBLE BRONCHOSCOPY W/ UPPER ENDOSCOPY    . INNER EAR  SURGERY     Family History: History reviewed. No pertinent family history. Family Psychiatric  History: Positive for mild depression Social History:  History  Alcohol Use  . 0.6 oz/week  . 1 Cans of beer per week    Comment: half a beer a day     History  Drug Use  . Types: Methamphetamines, Marijuana    Comment: patient states, "I've been off of it a month and a half"    Social History   Social History  . Marital status: Single    Spouse name: N/A  . Number of children: N/A  . Years of education: N/A   Social History Main Topics  . Smoking status: Current Every Day Smoker    Packs/day: 0.50    Types: Cigarettes  . Smokeless tobacco: Never Used  . Alcohol use 0.6 oz/week    1 Cans of beer per week     Comment: half a beer a day  . Drug use: Yes    Types: Methamphetamines, Marijuana     Comment: patient states, "I've been off of it a month and a half"  . Sexual activity: Yes    Birth control/ protection: None   Other Topics Concern  . None   Social History Narrative  . None   Additional Social History:    History of alcohol / drug use?: Yes Negative Consequences of Use: Personal relationships Withdrawal Symptoms: Agitation, Irritability                    Sleep: Fair  Appetite:  Good  Current Medications: Current Facility-Administered Medications  Medication Dose Route Frequency Provider Last Rate  Last Dose  . acetaminophen (TYLENOL) tablet 650 mg  650 mg Oral Q6H PRN Audery Amel, MD      . alum & mag hydroxide-simeth (MAALOX/MYLANTA) 200-200-20 MG/5ML suspension 30 mL  30 mL Oral Q4H PRN Audery Amel, MD      . magnesium hydroxide (MILK OF MAGNESIA) suspension 30 mL  30 mL Oral Daily PRN Audery Amel, MD      . nicotine (NICODERM CQ - dosed in mg/24 hours) patch 21 mg  21 mg Transdermal Daily Jimmy Footman, MD   21 mg at 05/31/16 1610  . OLANZapine zydis (ZYPREXA) disintegrating tablet 7.5 mg  7.5 mg Oral BID AC Jimmy Footman, MD   7.5 mg at 05/31/16 0852  . white petrolatum (VASELINE) gel   Topical PRN Audery Amel, MD        Lab Results:  No results found for this or any previous visit (from the past 48 hour(s)).  Blood Alcohol level:  Lab Results  Component Value Date   ETH 7 (H) 05/28/2016    Metabolic Disorder Labs: Lab Results  Component Value Date   HGBA1C 5.0 05/29/2016   MPG 97 05/29/2016   Lab Results  Component Value Date   PROLACTIN 34.9 (H) 05/29/2016   Lab Results  Component Value Date   CHOL 192 05/29/2016   TRIG 78 05/29/2016   HDL 50 05/29/2016   CHOLHDL 3.8 05/29/2016   VLDL 16 05/29/2016   LDLCALC 126 (H) 05/29/2016    Physical Findings: AIMS: Facial and Oral Movements Muscles of Facial Expression: None, normal Lips and Perioral Area: None, normal Jaw: None, normal Tongue: None, normal,Extremity Movements Upper (arms, wrists, hands, fingers): None, normal Lower (legs, knees, ankles, toes): None, normal, Trunk Movements Neck, shoulders, hips: None, normal, Overall Severity Severity of abnormal movements (highest score from questions above): None, normal Incapacitation due to abnormal movements: None, normal Patient's awareness of abnormal movements (rate only patient's report): No Awareness, Dental Status Current problems with teeth and/or dentures?: No Does patient usually wear dentures?: No  CIWA:  CIWA-Ar Total: 4 COWS:     Musculoskeletal: Strength & Muscle Tone: within normal limits Gait & Station: normal Patient leans: N/A  Psychiatric Specialty Exam: Physical Exam  Nursing note and vitals reviewed. Constitutional: She appears well-developed and well-nourished.  HENT:  Head: Normocephalic and atraumatic.  Eyes: Conjunctivae are normal. Pupils are equal, round, and reactive to light.  Neck: Normal range of motion.  Cardiovascular: Regular rhythm and normal heart sounds.   Respiratory: Effort normal. No respiratory distress.  GI:  Soft.  Musculoskeletal: Normal range of motion.  Neurological: She is alert.  Skin: Skin is warm and dry.  Psychiatric: She has a normal mood and affect. Her speech is normal and behavior is normal. Thought content is not paranoid. She expresses impulsivity. She expresses no homicidal and no suicidal ideation. She exhibits abnormal recent memory.    Review of Systems  Constitutional: Negative.   HENT: Negative.   Eyes: Negative.   Respiratory: Negative.   Cardiovascular: Negative.   Gastrointestinal: Negative.   Musculoskeletal: Negative.   Skin: Negative.   Neurological: Negative.   Psychiatric/Behavioral: Positive for memory loss and substance abuse. Negative for depression, hallucinations and suicidal ideas. The patient is nervous/anxious. The patient does not have insomnia.     Blood pressure 111/71, pulse 77, temperature 97.6 F (36.4 C), temperature source Oral, resp. rate 18, height 5' (1.524 m), weight 58.5 kg (129 lb), last menstrual period 05/24/2016.Body  mass index is 25.19 kg/m.  General Appearance: Casual  Eye Contact:  Good  Speech:  Normal Rate  Volume:  Normal  Mood:  Anxious  Affect:  Appropriate  Thought Process:  Goal Directed  Orientation:  Full (Time, Place, and Person)  Thought Content:  Logical  Suicidal Thoughts:  No  Homicidal Thoughts:  No  Memory:  Immediate;   Fair Recent;   Fair Remote;   Fair  Judgement:  Fair  Insight:  Fair  Psychomotor Activity:  Normal  Concentration:  Concentration: Fair  Recall:  Fair  Fund of Knowledge:  Good  Language:  Good  Akathisia:  No  Handed:  Right  AIMS (if indicated):     Assets:  Communication Skills Desire for Improvement Housing Physical Health Resilience  ADL's:  Intact  Cognition:  WNL  Sleep:  Number of Hours: 8     Treatment Plan Summary: Daily contact with patient to assess and evaluate symptoms and progress in treatment, Medication management and Plan Supportive counseling. No change to  treatment plan. Continue current medicine. Patient may be ready to go within the next day.  Mordecai RasmussenJohn Clapacs, MD 05/31/2016, 1:40 PM

## 2016-05-31 NOTE — Progress Notes (Signed)
Patient is cooperative. She denies SI/HI/AVH.Support and encouragement offered. No behavioral issues to report on shift at this time. Seems to be resting in bed quietly. Safety checks maintained.

## 2016-05-31 NOTE — Progress Notes (Signed)
Patient is cooperative, she spent most of the shift resting in bed after her family visit.She denies SI/HI/AVH. Support and encouragement offered. No behavioral issues to report on shift at this time.  Safety checks maintained.

## 2016-05-31 NOTE — Progress Notes (Signed)
D: Patient states she is sleeping and eating well.  Her energy level is normal and her concentration is good.  She denies any depressive symptoms and denies any thoughts of self harm.  She denies HI and does not appear to be responding to internal stimuli.  Her goal today is to "stay focused on daily routine of sustained business and also work on goals to ultimately gain daily recovery goals."  She is visible in the milieu and is interacting well with her peers.  She denies any withdrawal symptoms. A: Continue to monitor medication management and MD orders.  Safety checks completed every 15 minutes per protocol.  Offer support and encouragement as needed. R: Patient is receptive to staff; her behavior is appropriate.

## 2016-05-31 NOTE — [facility] Group Notes (Signed)
[facility] LCSW Group Therapy  05/31/2016 2:52 PM  Type of Therapy:  Group Therapy  Participation Level:  Active  Participation Quality:  Appropriate and Redirectable  Affect:  Appropriate  Cognitive:  Alert and Appropriate  Insight:  Developing/Improving  Engagement in Therapy:  Engaged  Modes of Intervention:  Activity, Discussion, Exploration, Problem-solving, Reality Testing, Socialization and Support  Summary of Progress/Problems: Stress management: Patients defined and discussed the topic of stress and the related symptoms and triggers for stress. Patients identified healthy coping skills they would like to try during hospitalization and after discharge to manage stress in a healthy way. CSW offered insight to varying stress management techniques. Patient was involved in a verbal argument with a peer when a peer in the group become angry with patient randomly. Patient was respectful when asked to leave. Patient was able to return to the group later and shared appropriately with the group.   Gwendolyn Grunden G. Gwendolyn Gonzales MSW, LCSWA 05/31/2016, 3:00 PM

## 2016-06-01 DIAGNOSIS — F19950 Other psychoactive substance use, unspecified with psychoactive substance-induced psychotic disorder with delusions: Secondary | ICD-10-CM

## 2016-06-01 DIAGNOSIS — F19959 Other psychoactive substance use, unspecified with psychoactive substance-induced psychotic disorder, unspecified: Secondary | ICD-10-CM

## 2016-06-01 MED ORDER — OLANZAPINE 5 MG PO TBDP
5.0000 mg | ORAL_TABLET | Freq: Every day | ORAL | 0 refills | Status: DC
Start: 2016-06-01 — End: 2023-04-19
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
## 2016-06-01 NOTE — Progress Notes (Signed)
Pt awake, alert, oriented and up on unit today. Requests discharge. Appropriately interacts with staff/peers. Reports good sleep last night, good appetite, normal energy, good concentration. Rates depression 0/10, hopelessness 0/10, anxiety 0/10 (low 0-10 high) Denies SI/HI/AVH. Goal today is "eating, taking my meds, focusing on discharge plan, sticking to routine after discharge, therapy group for release recovery" by " stay focused, maintain routine, cooperation." Pt is pleasant and cooperative with more organized thoughts observed and reported. Support and encouragement provided. Medications administered with education as ordered. Safety maintained with every 15 minute checks. Will continue to monitor.

## 2016-06-01 NOTE — Tx Team (Signed)
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 acetaminophen (TYLENOL) tablet 650 mg  650 mg Oral Q6H PRN Audery Amel, MD      . alum & mag hydroxide-simeth (MAALOX/MYLANTA) 200-200-20 MG/5ML suspension 30 mL  30 mL Oral Q4H PRN Audery Amel, MD      . magnesium hydroxide (MILK OF MAGNESIA) suspension 30 mL  30 mL Oral Daily PRN Audery Amel, MD      . nicotine (NICODERM CQ - dosed in mg/24 hours) patch 21 mg  21 mg Transdermal Daily Jimmy Footman, MD   21 mg at 06/01/16 0744  . OLANZapine zydis (ZYPREXA) disintegrating tablet 7.5 mg  7.5 mg Oral BID AC Jimmy Footman, MD   7.5 mg at 06/01/16 0742  . white petrolatum (VASELINE) gel   Topical PRN Audery Amel, MD       Current Outpatient Prescriptions  Medication Sig Dispense Refill  . OLANZapine zydis (ZYPREXA) 5 MG disintegrating tablet Take 1 tablet (5 mg total) by mouth at bedtime. 30 tablet 0   PTA Medications: No prescriptions prior to admission.    Patient Stressors:    Patient Strengths:    Treatment Modalities: Medication Management, Group therapy, Case management,  1 to 1 session with clinician, Psychoeducation, Recreational therapy.   Physician Treatment Plan for Primary Diagnosis: Substance-induced psychotic disorder with delusions (HCC) Long Term Goal(s): Improvement in symptoms so as ready for discharge Improvement in symptoms so as  ready for discharge   Short Term Goals: Ability to identify changes in lifestyle to reduce recurrence of condition will improve Ability to demonstrate self-control will improve Ability to identify triggers associated with substance abuse/mental health issues will improve Ability to identify and develop effective coping behaviors will improve  Medication Management: Evaluate patient's response, side effects, and tolerance of medication regimen.  Therapeutic Interventions: 1 to 1 sessions, Unit Group sessions and Medication administration.  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) Active Problems:   Methamphetamine use disorder, severe (HCC)   Tobacco use disorder   Cannabis use disorder, moderate, dependence (HCC)  Long Term Goal(s): Improvement in symptoms so as ready for discharge Improvement in symptoms so as ready for discharge   Short Term Goals: Ability to identify changes in lifestyle to reduce recurrence of condition will improve Ability to demonstrate self-control will improve Ability to identify triggers associated with substance abuse/mental health issues will improve Ability to identify and develop effective coping behaviors will improve     Medication Management: Evaluate patient's response, side effects, and tolerance of medication regimen.  Therapeutic Interventions: 1 to 1 sessions, Unit Group sessions and Medication administration.  Evaluation of Outcomes: Adequate for Discharge   RN Treatment Plan for Primary Diagnosis: Substance-induced psychotic disorder with delusions (HCC) Long Term Goal(s): Knowledge of disease and therapeutic regimen to maintain health will improve  Short Term Goals: Ability to verbalize feelings will improve, Ability to identify and develop effective coping behaviors will improve and  Compliance  with prescribed medications will improve  Medication Management: RN will administer medications as ordered by provider, will assess and evaluate patient's response and provide education to patient for prescribed medication. RN will report any adverse and/or side effects to prescribing provider.  Therapeutic Interventions: 1 on 1 counseling sessions, Psychoeducation, Medication administration, Evaluate responses to treatment, Monitor vital signs and CBGs as ordered, Perform/monitor CIWA, COWS, AIMS and Fall Risk screenings as ordered, Perform wound care treatments as ordered.  Evaluation of Outcomes: Adequate for Discharge   LCSW Treatment Plan for Primary Diagnosis: Substance-induced psychotic disorder with delusions (HCC) Long Term Goal(s): Safe transition to appropriate next level of care at discharge, Engage patient in therapeutic group addressing interpersonal concerns.  Short Term Goals: Engage patient in aftercare planning with referrals and resources and Increase ability to appropriately verbalize feelings  Therapeutic Interventions: Assess for all discharge needs, 1 to 1 time with Social worker, Explore available resources and support systems, Assess for adequacy in community support network, Educate family and significant other(s) on suicide prevention, Complete Psychosocial Assessment, Interpersonal group therapy.  Evaluation of Outcomes: Adequate for Discharge   Progress in Treatment: Attending groups: Yes Participating in groups: Yes Taking medication as prescribed: Yes. Toleration medication: Yes. Family/Significant other contact made: No, will contact:  patient's boyfriend Patient understands diagnosis: Yes. Discussing patient identified problems/goals with staff: Yes. Medical problems stabilized or resolved: Yes. Denies suicidal/homicidal ideation: Yes. Issues/concerns per patient self-inventory: No. Other: n/a  New problem(s) identified: None identified at  this time.   New Short Term/Long Term Goal(s): Goals identified by patient during treatment team: "I want to talk about my feelings and stay positive".   Discharge Plan or Barriers: Patient will discharge with outpatient services for medication management/outpatient therapy.   Reason for Continuation of Hospitalization: None  Estimated Length of Stay: Discharge today.  Attendees: Patient:  06/01/2016   Physician: Dr. Jimmy FootmanAndrea Hernandez- Gonzalez, MD 06/01/2016   Nursing: Shelia MediaJanet Jones, RN 06/01/2016   RN Care Manager: 06/01/2016   Social Worker: Daleen SquibbGreg Krystle Oberman MSW, LCSW 06/01/2016   Recreational Therapist: Jacquelynn CreeElizabeth M. Greene, LRT/CTRS 06/01/2016   Other:  06/01/2016   Other:  06/01/2016  Other: 06/01/2016     Scribe for Treatment Team: Lorri FrederickWierda, Tiondra Fang Jon, LCSW 06/01/2016 3:11 PM

## 2016-06-01 NOTE — Progress Notes (Signed)
Provided and reviewed discharge paperwork and prescriptions. Verified understanding by use of teach back method. Pt verbalizes understanding as well. Denies SI/HI/AVH. Belongings returned as noted once discharged from unit. Pt has a 7 day supply of medications being filled at the pharmacy to be provided at discharged, however pt is refusing to stay until the medications are filled. Dr. Ardyth HarpsHernandez aware, pt ok to discharge as soon as her ride is here. Pt reports her boyfriend is on his way to pick her up. Safety maintained with every 15 minute checks. Will continue to monitor until discharged.
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
## 2016-06-01 NOTE — Progress Notes (Signed)
June 01, 2016   Patient:  Gwendolyn Gonzales is an 31 y.o., female MRN:  [id number] DOB:  1985/10/29 Patient phone:  [phone] (home)          Patient address:   [address] [place] [address],     To whom it may concern:  This patient was hospitalized at [facility] from 05/28/16 to 06/01/16. This patient has been medically cleared and will be able to return to school/work on 06/02/16.   If more information is needed about this case, please do not hesitate to contact me at [phone].  Sincerely,    Radene JourneyAndrea Hernandez MD

## 2016-06-01 NOTE — Progress Notes (Signed)
  [facility] :  Will you be returning to the same living situation after discharge:  Yes,  with boyfriend At discharge, do you have transportation home?: boyfriend Do you have the ability to pay for your medications: No: [facility]  Release of information consent forms completed and in the chart;  Patient's signature needed at discharge.  Patient to Follow up at: Follow-up Information    [facility]. Go on 06/03/2016.   Specialty:  Behavioral Health Why:  Follow-up for outpatient services including medication management/outpatient therapy. Bring Photo I.D., discharge summary, and current medications with you to this appointment. Walk-in hours for new patients M-F 8a-3pm. Arrive early for prompt service. Contact information: [address] [place] [address] [phone]           Next level of care provider [facility]:no  Safety Planning and Suicide Prevention discussed: Yes,  with boyfriend  Have you used any form of tobacco in the last 30 days? (Cigarettes, Smokeless Tobacco, Cigars, and/or Pipes): Yes  Has patient been referred to the Quitline?: Patient refused referral  Patient has been referred for addiction treatment: Yes  Lorri FrederickWierda, Anani Gu Jon, LCSW 06/01/2016, 3:13 PM

## 2017-01-12 ENCOUNTER — Emergency Department
Admission: EM | Admit: 2017-01-12 | Discharge: 2017-01-12 | Disposition: A | Discharge status: Home / Self Care | Payer: Self-pay | Attending: Student in an Organized Health Care Education/Training Program | Admitting: Student in an Organized Health Care Education/Training Program

## 2017-01-12 ENCOUNTER — Encounter: Payer: Self-pay | Admitting: Emergency Medicine

## 2017-01-12 DIAGNOSIS — F17210 Nicotine dependence, cigarettes, uncomplicated: Secondary | ICD-10-CM | POA: Insufficient documentation

## 2017-01-12 DIAGNOSIS — J029 Acute pharyngitis, unspecified: Secondary | ICD-10-CM | POA: Insufficient documentation

## 2017-01-12 LAB — POCT RAPID STREP A: STREPTOCOCCUS, GROUP A SCREEN (DIRECT): NEGATIVE

## 2017-01-12 MED ORDER — DEXAMETHASONE SODIUM PHOSPHATE 10 MG/ML IJ SOLN
10.0000 mg | Freq: Once | INTRAMUSCULAR | Status: AC
  Administered 2017-01-12: 10 mg via INTRAMUSCULAR
  Filled 2017-01-12: qty 1

## 2017-01-12 MED ORDER — CLINDAMYCIN HCL 300 MG PO CAPS: 300.0000 mg | ORAL_CAPSULE | Freq: Three times a day (TID) | ORAL | 0 refills | Status: AC

## 2017-01-12 MED ORDER — LIDOCAINE VISCOUS 2 % MT SOLN
15.0000 mL | Freq: Once | OROMUCOSAL | Status: AC
  Administered 2017-01-12: 15 mL via OROMUCOSAL
  Filled 2017-01-12: qty 15

## 2017-01-12 MED ORDER — LIDOCAINE VISCOUS 2 % MT SOLN: 10.0000 mL | OROMUCOSAL | 0 refills | Status: DC | PRN

## 2017-01-12 NOTE — ED Provider Notes (Signed)
[facility] Emergency Department Provider Note  ____________________________________________  Time seen: Approximately 3:25 PM  I have reviewed the triage vital signs and the nursing notes.   HISTORY  Chief Complaint Sore Throat    HPI Gwendolyn Gonzales is a 31 y.o. female that presents to the emergency department for evaluation of sore throat for 3 days. She can see white patches in the back of her throat. The left side of her throat hurts worse than the right side of her throat. She is sure that she has strep. She had a fever 100.3 on Sunday. She has been putting garlic in her mouth and using alcohol vinegar mouthwashes for relief. She states that her niece was diagnosed with strep throat. No nasal congestion, cough, SOB, CP, nausea, vomiting, abdominal pain.   History reviewed. No pertinent past medical history.  Patient Active Problem List   Diagnosis Date Noted  . 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 Methamphetamine use disorder, severe (HCC) 05/29/2016  . Tobacco use disorder 05/29/2016  . Cannabis use disorder, moderate, dependence (HCC) 05/29/2016    Past Surgical History:  Procedure Laterality Date  . CESAREAN SECTION    . CESAREAN SECTION  15 May 2006  . FLEXIBLE BRONCHOSCOPY W/ UPPER ENDOSCOPY    . INNER EAR SURGERY      Prior to Admission medications   Medication Sig Start Date End Date Taking? Authorizing Provider  clindamycin (CLEOCIN) 300 MG capsule Take 1 capsule (300 mg total) by mouth 3 (three) times daily. 01/12/17 01/22/17  Enid DerryWagner, Aldonia Keeven, PA-C  lidocaine (XYLOCAINE) 2 % solution Use as directed 10 mLs in the mouth or throat as needed for mouth pain. 01/12/17   Enid DerryWagner, Olman Yono, PA-C  OLANZapine zydis (ZYPREXA) 5 MG disintegrating tablet Take 1 tablet (5 mg total) by mouth at bedtime. 06/01/16   Jimmy FootmanHernandez-Gonzalez, Andrea, MD    Allergies Lactose intolerance (gi); Augmentin  [amoxicillin-pot clavulanate]; and Biaxin [clarithromycin]  History reviewed. No pertinent family history.  Social History Social History  Substance Use Topics  . Smoking status: Current Every Day Smoker    Packs/day: 0.25    Types: Cigarettes  . Smokeless tobacco: Former NeurosurgeonUser  . Alcohol use 0.6 oz/week    1 Cans of beer per week     Comment: occasional     Review of Systems  Constitutional: No fever/chills Cardiovascular: No chest pain. Respiratory: No SOB. Gastrointestinal: No abdominal pain.  No nausea, no vomiting.  Musculoskeletal: Negative for musculoskeletal pain. Skin: Negative for rash, abrasions, lacerations, ecchymosis. Neurological: Negative for headaches, numbness or tingling   ____________________________________________   PHYSICAL EXAM:  VITAL SIGNS: ED Triage Vitals  Enc Vitals Group     BP 01/12/17 1423 131/84     Pulse Rate 01/12/17 1423 88     Resp 01/12/17 1423 18     Temp 01/12/17 1423 98.8 F (37.1 C)     Temp Source 01/12/17 1423 Oral     SpO2 01/12/17 1423 98 %     Weight 01/12/17 1424 135 lb (61.2 kg)     Height 01/12/17 1424 4\' 9"  (1.448 m)     Head Circumference --      Peak Flow --      Pain Score 01/12/17 1422 10     Pain Loc --      Pain Edu? --      Excl. in GC? --      Constitutional: Alert and oriented. Well appearing and in no  acute distress. Eyes: Conjunctivae are normal. PERRL. EOMI. Head: Atraumatic. ENT:      Ears: Tympanic membranes pearly.      Nose: No congestion/rhinnorhea.      Mouth/Throat: Mucous membranes are moist. Pharynx erythematous. Tonsils nonenlarged. White exudates. Uvula midline. Neck: No stridor. Cardiovascular: Normal rate, regular rhythm.  Good peripheral circulation. Respiratory: Normal respiratory effort without tachypnea or retractions. Lungs CTAB. Good air entry to the bases with no decreased or absent breath sounds. Gastrointestinal: Bowel sounds 4 quadrants. Soft and nontender to palpation.  No guarding or rigidity. No palpable masses. No distention.  Musculoskeletal: Full range of motion to all extremities. No gross deformities appreciated. Neurologic:  Normal speech and language. No gross focal neurologic deficits are appreciated.  Skin:  Skin is warm, dry and intact. No rash noted.  ____________________________________________   LABS (all labs ordered are listed, but only abnormal results are displayed)  Labs Reviewed  CULTURE, GROUP A STREP [facility])  POCT RAPID STREP A   ____________________________________________  EKG   ____________________________________________  RADIOLOGY   No results found.  ____________________________________________    PROCEDURES  Procedure(s) performed:    Procedures    Medications  lidocaine (XYLOCAINE) 2 % viscous mouth solution 15 mL (15 mLs Mouth/Throat Given 01/12/17 1612)  dexamethasone (DECADRON) injection 10 mg (10 mg Intramuscular Given 01/12/17 1612)     ____________________________________________   INITIAL IMPRESSION / ASSESSMENT AND PLAN / ED COURSE  Pertinent labs & imaging results that were available during my care of the patient were reviewed by me and considered in my medical decision making (see chart for details).  Review of the [place] CSRS was performed in accordance of the [facility] prior to dispensing any controlled drugs.   Patient presented to the emergency department for evaluation of sore throat. Vital signs and exam are reassuring. She has been exposed to strep throat this week. Strep test negative but will be sent for culture. She is allergic to amoxicillin and clarithromycin, and when she takes these she breaks out in hives. She was given a shot of IM Decadron in ED. Patient will be discharged home with prescriptions for clindamycin and viscous lidocaine. Patient is to follow up with PCP as directed. Patient is given ED precautions to return to the ED for any worsening or new  symptoms.     ____________________________________________  FINAL CLINICAL IMPRESSION(S) / ED DIAGNOSES  Final diagnoses:  Pharyngitis, unspecified etiology      NEW MEDICATIONS STARTED DURING THIS VISIT:  Discharge Medication List as of 01/12/2017  4:30 PM    START taking these medications   Details  clindamycin (CLEOCIN) 300 MG capsule Take 1 capsule (300 mg total) by mouth 3 (three) times daily., Starting Tue 01/12/2017, Until Fri 01/22/2017, Print    lidocaine (XYLOCAINE) 2 % solution Use as directed 10 mLs in the mouth or throat as needed for mouth pain., Starting Tue 01/12/2017, Print            This chart was dictated using voice recognition software/Dragon. Despite best efforts to proofread, errors can occur which can change the meaning. Any change was purely unintentional.    Enid Derry, PA-C 01/12/17 1710    Willy Eddy, MD 01/12/17 [phone]

## 2017-01-12 NOTE — ED Triage Notes (Signed)
Pt via pov from home with sore throat x 3 days. States that a family member had a positive strep test, and she awakened with very sore throat on Saturday. Also c/o chills, n/v. Pt alert & oriented with NAD noted.

## 2017-01-13 LAB — CULTURE, GROUP A STREP (THRC)

## 2017-02-09 ENCOUNTER — Emergency Department: Payer: Self-pay

## 2017-02-09 ENCOUNTER — Emergency Department
Admission: EM | Admit: 2017-02-09 | Discharge: 2017-02-09 | Disposition: A | Discharge status: Home / Self Care | Payer: Self-pay | Attending: Emergency Medicine | Admitting: Emergency Medicine

## 2017-02-09 ENCOUNTER — Encounter: Payer: Self-pay | Admitting: Emergency Medicine

## 2017-02-09 ENCOUNTER — Other Ambulatory Visit: Payer: Self-pay

## 2017-02-09 DIAGNOSIS — F17201 Nicotine dependence, unspecified, in remission: Secondary | ICD-10-CM | POA: Insufficient documentation

## 2017-02-09 DIAGNOSIS — Z3491 Encounter for supervision of normal pregnancy, unspecified, first trimester: Secondary | ICD-10-CM

## 2017-02-09 DIAGNOSIS — Z3A01 Less than 8 weeks gestation of pregnancy: Secondary | ICD-10-CM | POA: Insufficient documentation

## 2017-02-09 DIAGNOSIS — R1084 Generalized abdominal pain: Secondary | ICD-10-CM | POA: Insufficient documentation

## 2017-02-09 DIAGNOSIS — R1031 Right lower quadrant pain: Secondary | ICD-10-CM | POA: Insufficient documentation

## 2017-02-09 DIAGNOSIS — Z79899 Other long term (current) drug therapy: Secondary | ICD-10-CM | POA: Insufficient documentation

## 2017-02-09 DIAGNOSIS — R112 Nausea with vomiting, unspecified: Secondary | ICD-10-CM | POA: Insufficient documentation

## 2017-02-09 DIAGNOSIS — R197 Diarrhea, unspecified: Secondary | ICD-10-CM | POA: Insufficient documentation

## 2017-02-09 DIAGNOSIS — Z3490 Encounter for supervision of normal pregnancy, unspecified, unspecified trimester: Secondary | ICD-10-CM

## 2017-02-09 DIAGNOSIS — O0000 Abdominal pregnancy without intrauterine pregnancy: Secondary | ICD-10-CM | POA: Insufficient documentation

## 2017-02-09 LAB — URINALYSIS, COMPLETE (UACMP) WITH MICROSCOPIC
BACTERIA UA: NONE SEEN
Bilirubin Urine: NEGATIVE
Glucose, UA: NEGATIVE mg/dL
Hgb urine dipstick: NEGATIVE
Ketones, ur: NEGATIVE mg/dL
Leukocytes, UA: NEGATIVE
Nitrite: NEGATIVE
PROTEIN: NEGATIVE mg/dL
Specific Gravity, Urine: 1.016 (ref 1.005–1.030)
pH: 6 (ref 5.0–8.0)

## 2017-02-09 LAB — COMPREHENSIVE METABOLIC PANEL
ALBUMIN: 3.8 g/dL (ref 3.5–5.0)
ALT: 16 U/L (ref 14–54)
AST: 20 U/L (ref 15–41)
Alkaline Phosphatase: 44 U/L (ref 38–126)
Anion gap: 10 (ref 5–15)
BUN: 9 mg/dL (ref 6–20)
CHLORIDE: 106 mmol/L (ref 101–111)
CO2: 22 mmol/L (ref 22–32)
CREATININE: 0.56 mg/dL (ref 0.44–1.00)
Calcium: 8.6 mg/dL — ABNORMAL LOW (ref 8.9–10.3)
GFR calc Af Amer: >60 mL/min (ref >60)
GLUCOSE: 88 mg/dL (ref 65–99)
Potassium: 3.6 mmol/L (ref 3.5–5.1)
Sodium: 138 mmol/L (ref 135–145)
Total Bilirubin: 0.5 mg/dL (ref 0.3–1.2)
Total Protein: 6.3 g/dL — ABNORMAL LOW (ref 6.5–8.1)

## 2017-02-09 LAB — CBC
HEMATOCRIT: 42.5 % (ref 35.0–47.0)
HEMOGLOBIN: 14.7 g/dL (ref 12.0–16.0)
MCH: 31.2 pg (ref 26.0–34.0)
MCHC: 34.7 g/dL (ref 32.0–36.0)
MCV: 89.8 fL (ref 80.0–100.0)
Platelets: 225 10*3/uL (ref 150–440)
RBC: 4.73 MIL/uL (ref 3.80–5.20)
RDW: 13.2 % (ref 11.5–14.5)
WBC: 7.6 10*3/uL (ref 3.6–11.0)

## 2017-02-09 LAB — LIPASE, BLOOD: LIPASE: 23 U/L (ref 11–51)

## 2017-02-09 LAB — HCG, QUANTITATIVE, PREGNANCY: hCG, Beta Chain, Quant, S: 69710 m[IU]/mL — ABNORMAL HIGH (ref <5)

## 2017-02-09 LAB — POCT PREGNANCY, URINE: PREG TEST UR: POSITIVE — AB

## 2017-02-09 MED ORDER — ACETAMINOPHEN 500 MG PO TABS
1000.0000 mg | ORAL_TABLET | Freq: Once | ORAL | Status: AC
  Administered 2017-02-09: 1000 mg via ORAL

## 2017-02-09 MED ORDER — ACETAMINOPHEN 500 MG PO TABS
ORAL_TABLET | ORAL | Status: AC
  Filled 2017-02-09: qty 2

## 2017-02-09 MED ORDER — ONDANSETRON 4 MG PO TBDP
ORAL_TABLET | ORAL | Status: AC
  Filled 2017-02-09: qty 1

## 2017-02-09 MED ORDER — ONDANSETRON HCL 4 MG PO TABS: 4.0000 mg | ORAL_TABLET | Freq: Four times a day (QID) | ORAL | 1 refills | Status: DC | PRN

## 2017-02-09 MED ORDER — FAMOTIDINE 20 MG PO TABS: 20.0000 mg | ORAL_TABLET | Freq: Two times a day (BID) | ORAL | 0 refills | Status: DC

## 2017-02-09 MED ORDER — ONDANSETRON 4 MG PO TBDP
8.0000 mg | ORAL_TABLET | Freq: Once | ORAL | Status: AC
  Administered 2017-02-09: 8 mg via ORAL

## 2017-02-09 MED ORDER — ACETAMINOPHEN 325 MG PO TABS: 650.0000 mg | ORAL_TABLET | Freq: Four times a day (QID) | ORAL | 0 refills | Status: DC | PRN

## 2017-02-09 NOTE — ED Notes (Addendum)
Patient discharge and follow up information reviewed with patient by ED nursing staff and patient given the opportunity to ask questions pertaining to ED visit and discharge plan of care. Patient advised that should symptoms not continue to improve, resolve entirely, or should new symptoms develop then a follow up visit with their PCP or a return visit to the ED may be warranted. Patient verbalized consent and understanding of discharge plan of care including potential need for further evaluation. Patient being discharged in stable condition per attending ED physician on duty.   Pt declined BP check prior to DC, states she needs to get home

## 2017-02-09 NOTE — ED Triage Notes (Signed)
Pt c/o vomiting and watery diarrhea. Went to health dept today and is pregnant, they think 2 months.  Has had right abdominal pain she feels is different than her previous pregnancy.  G2P1.  Describes as stabbing.  When pain on RLQ eases pain radiates to LLQ.  Feels like contractions per pt.

## 2017-02-09 NOTE — ED Provider Notes (Signed)
[facility] Emergency Department Provider Note  ____________________________________________  Time seen: Approximately 9:30 PM  I have reviewed the triage vital signs and the nursing notes.   HISTORY  Chief Complaint Emesis and pregnant    HPI Gwendolyn Gonzales is a 31 y.o. female reports being 2 months pregnant and since yesterday has had vomiting and watery diarrhea as well as generalized abdominal cramping pain. Nonradiating, no aggravating or alleviating factors. Moderate intensity. Does not feel like previous pregnancy related discomfort or morning sickness. Spinney or to drink some fluids but limited due to her symptoms. No fever. No trauma.     History reviewed. No pertinent past medical history.   Patient Active Problem List   Diagnosis Date Noted  . 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 Methamphetamine use disorder, severe (HCC) 05/29/2016  . Tobacco use disorder 05/29/2016  . Cannabis use disorder, moderate, dependence (HCC) 05/29/2016     Past Surgical History:  Procedure Laterality Date  . CESAREAN SECTION    . CESAREAN SECTION  15 May 2006  . FLEXIBLE BRONCHOSCOPY W/ UPPER ENDOSCOPY    . INNER EAR SURGERY       Prior to Admission medications   Medication Sig Start Date End Date Taking? Authorizing Provider  acetaminophen (TYLENOL) 325 MG tablet Take 2 tablets (650 mg total) by mouth every 6 (six) hours as needed. 02/09/17   Sharman CheekStafford, Dorianne Perret, MD  famotidine (PEPCID) 20 MG tablet Take 1 tablet (20 mg total) by mouth 2 (two) times daily. 02/09/17   Sharman CheekStafford, Devesh Monforte, MD  lidocaine (XYLOCAINE) 2 % solution Use as directed 10 mLs in the mouth or throat as needed for mouth pain. 01/12/17   Enid DerryWagner, Ashley, PA-C  OLANZapine zydis (ZYPREXA) 5 MG disintegrating tablet Take 1 tablet (5 mg total) by mouth at bedtime. 06/01/16   Jimmy FootmanHernandez-Gonzalez, Andrea, MD  ondansetron (ZOFRAN) 4 MG  tablet Take 1 tablet (4 mg total) by mouth every 6 (six) hours as needed for nausea or vomiting. 02/09/17   Sharman CheekStafford, Kenishia Plack, MD     Allergies Lactose intolerance (gi); Augmentin [amoxicillin-pot clavulanate]; and Biaxin [clarithromycin]   History reviewed. No pertinent family history.  Social History Social History   Tobacco Use  . Smoking status: Current Every Day Smoker    Packs/day: 0.25    Types: Cigarettes  . Smokeless tobacco: Former Engineer, waterUser  Substance Use Topics  . Alcohol use: Yes    Alcohol/week: 0.6 oz    Types: 1 Cans of beer per week    Comment: occasional  . Drug use: No    Comment: patient states, "I've been off of it a month and a half"    Review of Systems  Constitutional:   No fever positive chills.  ENT:   No sore throat. No rhinorrhea. Cardiovascular:   No chest pain or syncope. Respiratory:   No dyspnea or cough. Gastrointestinal:  Positive generalized abdominal pain, vomiting, and diarrhea..  Musculoskeletal:   Negative for focal pain or swelling All other systems reviewed and are negative except as documented above in ROS and HPI.  ____________________________________________   PHYSICAL EXAM:  VITAL SIGNS: ED Triage Vitals [02/09/17 1714]  Enc Vitals Group     BP 108/80     Pulse Rate 93     Resp 18     Temp 98.4 F (36.9 C)     Temp Source Oral     SpO2 99 %     Weight 131 lb (59.4 kg)  Height 4\' 9"  (1.448 m)     Head Circumference      Peak Flow      Pain Score 7     Pain Loc      Pain Edu?      Excl. in GC?     Vital signs reviewed, nursing assessments reviewed.   Constitutional:   Alert and oriented. Well appearing and in no distress. Eyes:   No scleral icterus.  EOMI. No nystagmus. No conjunctival pallor. PERRL. ENT   Head:   Normocephalic and atraumatic.   Nose:   No congestion/rhinnorhea.    Mouth/Throat:   Dry mucous membranes, no pharyngeal erythema. No peritonsillar mass.    Neck:   No meningismus.  Full ROM. Hematological/Lymphatic/Immunilogical:   No cervical lymphadenopathy. Cardiovascular:   RRR. Symmetric bilateral radial and DP pulses.  No murmurs.  Respiratory:   Normal respiratory effort without tachypnea/retractions. Breath sounds are clear and equal bilaterally. No wheezes/rales/rhonchi. Gastrointestinal:   Soft and nontender. Non distended. There is no CVA tenderness.  No rebound, rigidity, or guarding. Genitourinary:   deferred Musculoskeletal:   Normal range of motion in all extremities. No joint effusions.  No lower extremity tenderness.  No edema. Neurologic:   Normal speech and language.  Motor grossly intact. No gross focal neurologic deficits are appreciated.  Skin:    Skin is warm, dry and intact. No rash noted.  No petechiae, purpura, or bullae.  ____________________________________________    LABS (pertinent positives/negatives) (all labs ordered are listed, but only abnormal results are displayed) Labs Reviewed  COMPREHENSIVE METABOLIC PANEL - Abnormal; Notable for the following components:      Result Value   Calcium 8.6 (*)    Total Protein 6.3 (*)    All other components within normal limits  URINALYSIS, COMPLETE (UACMP) WITH MICROSCOPIC - Abnormal; Notable for the following components:   Color, Urine YELLOW (*)    APPearance HAZY (*)    Squamous Epithelial / LPF 6-30 (*)    All other components within normal limits  HCG, QUANTITATIVE, PREGNANCY - Abnormal; Notable for the following components:   hCG, Beta Chain, Quant, S 69,710 (*)    All other components within normal limits  POCT PREGNANCY, URINE - Abnormal; Notable for the following components:   Preg Test, Ur POSITIVE (*)    All other components within normal limits  LIPASE, BLOOD  CBC  POC URINE PREG, ED   ____________________________________________   EKG    ____________________________________________    RADIOLOGY  US Ob Comp Less 14 Wks  Result Date: 02/09/2017 CLINICAL  DATA:  Initial evaluation for acute right lower quadrant pain for 4 days. Early pregnancy. EXAM: OBSTETRIC <14 WK [place] AND TRANSVAGINAL OB US TECHNIQUE: Both transabdominal and transvaginal ultrasound examinations were performed for complete evaluation of the gestation as well as the maternal uterus, adnexal regions, and pelvic cul-de-sac. Transvaginal technique was performed to assess early pregnancy. COMPARISON:  None. FINDINGS: Intrauterine gestational sac: Single Yolk sac:  Present Embryo:  Present Cardiac Activity: Present Heart Rate: 106  bpm CRL:  5.3  mm   6 w   2 d                  [place] EDC: 10/03/2017 Subchorionic hemorrhage:  None visualized. Maternal uterus/adnexae: Left ovary is normal in appearance. Probable small corpus luteal cyst noted within the right ovary. Small volume free fluid noted within pelvis, most notable within the left adnexa and adjacent to the uterus. IMPRESSION: 1. Single  viable intrauterine pregnancy as above, estimated gestational age [redacted] weeks and 2 days by crown-rump length. No complication. 2. Small probable right ovarian corpus luteal cyst with small volume free physiologic fluid within the pelvis. 3. No other acute maternal uterine or adnexal abnormality. Electronically Signed   By: Rise MuBenjamin  McClintock M.D.   On: 02/09/2017 18:57   [place] Ob Transvaginal  Result Date: 02/09/2017 CLINICAL DATA:  Initial evaluation for acute right lower quadrant pain for 4 days. Early pregnancy. EXAM: OBSTETRIC <14 WK US AND TRANSVAGINAL OB US TECHNIQUE: Both transabdominal and transvaginal ultrasound examinations were performed for complete evaluation of the gestation as well as the maternal uterus, adnexal regions, and pelvic cul-de-sac. Transvaginal technique was performed to assess early pregnancy. COMPARISON:  None. FINDINGS: Intrauterine gestational sac: Single Yolk sac:  Present Embryo:  Present Cardiac Activity: Present Heart Rate: 106  bpm CRL:  5.3  mm   6 w   2 d                  US EDC:  10/03/2017 Subchorionic hemorrhage:  None visualized. Maternal uterus/adnexae: Left ovary is normal in appearance. Probable small corpus luteal cyst noted within the right ovary. Small volume free fluid noted within pelvis, most notable within the left adnexa and adjacent to the uterus. IMPRESSION: 1. Single viable intrauterine pregnancy as above, estimated gestational age [redacted] weeks and 2 days by crown-rump length. No complication. 2. Small probable right ovarian corpus luteal cyst with small volume free physiologic fluid within the pelvis. 3. No other acute maternal uterine or adnexal abnormality. Electronically Signed   By: Rise MuBenjamin  McClintock M.D.   On: 02/09/2017 18:57    ____________________________________________   PROCEDURES Procedures  ____________________________________________     CLINICAL IMPRESSION / ASSESSMENT AND PLAN / ED COURSE  Pertinent labs & imaging results that were available during my care of the patient were reviewed by me and considered in my medical decision making (see chart for details).   Patient well-appearing no acute distress, normal vital signs. About [redacted] weeks pregnant by dates, but ultrasound shows signs consistent with 6 week 2 day gestational age. No apparent complications of pregnancy. Symptoms appear to be unrelated to pregnancy and given the recent holiday and time of year and current widespread viral illnesses in this community, her symptoms are highly consistent with a viral syndrome.  Exam is reassuring. Considering the patient's symptoms, medical history, and physical examination today, I have low suspicion for cholecystitis or biliary pathology, pancreatitis, perforation or bowel obstruction, hernia, intra-abdominal abscess, AAA or dissection, volvulus or intussusception, mesenteric ischemia, or appendicitis.  Symptoms are controlled with Zofran. Tolerating oral intake. I'll continue her on Zofran, H2 blocker, follow up with primary care and prenatal  care.      ____________________________________________   FINAL CLINICAL IMPRESSION(S) / ED DIAGNOSES    Final diagnoses:  Nausea vomiting and diarrhea  Generalized abdominal pain  First trimester pregnancy  Intrauterine pregnancy      This SmartLink is deprecated. Use AVSMEDLIST instead to display the medication list for a patient.   Portions of this note were generated with dragon dictation software. Dictation errors may occur despite best attempts at proofreading.    Sharman CheekStafford, Reubin Bushnell, MD 02/09/17 2135

## 2017-05-06 LAB — HM PAP SMEAR: HM Pap smear: NEGATIVE

## 2017-05-10 DIAGNOSIS — B977 Papillomavirus as the cause of diseases classified elsewhere: Secondary | ICD-10-CM | POA: Insufficient documentation

## 2017-06-11 ENCOUNTER — Encounter: Payer: Self-pay | Admitting: Emergency Medicine

## 2017-06-11 ENCOUNTER — Emergency Department: Payer: Medicaid Other

## 2017-06-11 ENCOUNTER — Observation Stay
Admission: EM | Admit: 2017-06-11 | Discharge: 2017-06-13 | Disposition: A | Discharge status: Home / Self Care | Payer: Medicaid Other | Attending: Internal Medicine | Admitting: Internal Medicine

## 2017-06-11 ENCOUNTER — Other Ambulatory Visit: Payer: Self-pay

## 2017-06-11 DIAGNOSIS — K920 Hematemesis: Secondary | ICD-10-CM | POA: Insufficient documentation

## 2017-06-11 DIAGNOSIS — R05 Cough: Secondary | ICD-10-CM | POA: Insufficient documentation

## 2017-06-11 DIAGNOSIS — N3000 Acute cystitis without hematuria: Secondary | ICD-10-CM

## 2017-06-11 DIAGNOSIS — K921 Melena: Principal | ICD-10-CM | POA: Insufficient documentation

## 2017-06-11 DIAGNOSIS — N939 Abnormal uterine and vaginal bleeding, unspecified: Secondary | ICD-10-CM | POA: Diagnosis not present

## 2017-06-11 DIAGNOSIS — K922 Gastrointestinal hemorrhage, unspecified: Secondary | ICD-10-CM | POA: Diagnosis present

## 2017-06-11 DIAGNOSIS — F17210 Nicotine dependence, cigarettes, uncomplicated: Secondary | ICD-10-CM | POA: Diagnosis not present

## 2017-06-11 DIAGNOSIS — R059 Cough, unspecified: Secondary | ICD-10-CM

## 2017-06-11 LAB — COMPREHENSIVE METABOLIC PANEL
ALK PHOS: 65 U/L (ref 38–126)
ALT: 19 U/L (ref 14–54)
AST: 24 U/L (ref 15–41)
Albumin: 4 g/dL (ref 3.5–5.0)
Anion gap: 9 (ref 5–15)
BUN: 13 mg/dL (ref 6–20)
CALCIUM: 8.5 mg/dL — AB (ref 8.9–10.3)
CO2: 22 mmol/L (ref 22–32)
Chloride: 108 mmol/L (ref 101–111)
Creatinine, Ser: 0.79 mg/dL (ref 0.44–1.00)
GFR calc Af Amer: >60 mL/min (ref >60)
GFR calc non Af Amer: >60 mL/min (ref >60)
GLUCOSE: 88 mg/dL (ref 65–99)
Potassium: 3.6 mmol/L (ref 3.5–5.1)
SODIUM: 139 mmol/L (ref 135–145)
Total Bilirubin: 0.5 mg/dL (ref 0.3–1.2)
Total Protein: 7.2 g/dL (ref 6.5–8.1)

## 2017-06-11 LAB — CBC
HCT: 46.4 % (ref 35.0–47.0)
Hemoglobin: 15.3 g/dL (ref 12.0–16.0)
MCH: 29.9 pg (ref 26.0–34.0)
MCHC: 33.1 g/dL (ref 32.0–36.0)
MCV: 90.5 fL (ref 80.0–100.0)
PLATELETS: 207 10*3/uL (ref 150–440)
RBC: 5.13 MIL/uL (ref 3.80–5.20)
RDW: 14 % (ref 11.5–14.5)
WBC: 6.8 10*3/uL (ref 3.6–11.0)

## 2017-06-11 LAB — URINALYSIS, COMPLETE (UACMP) WITH MICROSCOPIC
BILIRUBIN URINE: NEGATIVE
Glucose, UA: NEGATIVE mg/dL
Ketones, ur: NEGATIVE mg/dL
Leukocytes, UA: NEGATIVE
Nitrite: NEGATIVE
Protein, ur: 100 mg/dL — AB
SPECIFIC GRAVITY, URINE: 1.031 — AB (ref 1.005–1.030)
pH: 5 (ref 5.0–8.0)

## 2017-06-11 LAB — APTT: aPTT: 26 seconds (ref 24–36)

## 2017-06-11 LAB — INFLUENZA PANEL BY PCR (TYPE A & B)
INFLBPCR: NEGATIVE
Influenza A By PCR: NEGATIVE

## 2017-06-11 LAB — PROTIME-INR
INR: 0.96
PROTHROMBIN TIME: 12.7 s (ref 11.4–15.2)

## 2017-06-11 LAB — TYPE AND SCREEN
ABO/RH(D): B POS
ANTIBODY SCREEN: NEGATIVE

## 2017-06-11 LAB — PREGNANCY, URINE: Preg Test, Ur: NEGATIVE

## 2017-06-11 LAB — LIPASE, BLOOD: Lipase: 47 U/L (ref 11–51)

## 2017-06-11 MED ORDER — ACETAMINOPHEN 325 MG PO TABS: 650.0000 mg | ORAL_TABLET | Freq: Four times a day (QID) | ORAL | Status: DC | PRN

## 2017-06-11 MED ORDER — BENZONATATE 100 MG PO CAPS
200.0000 mg | ORAL_CAPSULE | Freq: Once | ORAL | Status: AC
  Administered 2017-06-11: 200 mg via ORAL
  Filled 2017-06-11: qty 2

## 2017-06-11 MED ORDER — HYDROCODONE-ACETAMINOPHEN 5-325 MG PO TABS
1.0000 | ORAL_TABLET | ORAL | Status: DC | PRN
  Administered 2017-06-11 – 2017-06-13 (×5): 2 via ORAL
  Filled 2017-06-11 (×5): qty 2

## 2017-06-11 MED ORDER — NICOTINE 14 MG/24HR TD PT24
14.0000 mg | MEDICATED_PATCH | Freq: Every day | TRANSDERMAL | Status: DC
  Administered 2017-06-11 – 2017-06-13 (×3): 14 mg via TRANSDERMAL
  Filled 2017-06-11 (×3): qty 1

## 2017-06-11 MED ORDER — ONDANSETRON HCL 4 MG PO TABS
4.0000 mg | ORAL_TABLET | Freq: Four times a day (QID) | ORAL | Status: DC | PRN
  Administered 2017-06-11 – 2017-06-13 (×2): 4 mg via ORAL
  Filled 2017-06-11 (×2): qty 1

## 2017-06-11 MED ORDER — ONDANSETRON HCL 4 MG/2ML IJ SOLN
4.0000 mg | Freq: Four times a day (QID) | INTRAMUSCULAR | Status: DC | PRN
  Administered 2017-06-12 (×3): 4 mg via INTRAVENOUS
  Filled 2017-06-11 (×3): qty 2

## 2017-06-11 MED ORDER — ALBUTEROL SULFATE (2.5 MG/3ML) 0.083% IN NEBU: 2.5000 mg | INHALATION_SOLUTION | RESPIRATORY_TRACT | Status: DC | PRN

## 2017-06-11 MED ORDER — ACETAMINOPHEN 650 MG RE SUPP: 650.0000 mg | Freq: Four times a day (QID) | RECTAL | Status: DC | PRN

## 2017-06-11 MED ORDER — SODIUM CHLORIDE 0.9 % IV SOLN
80.0000 mg | Freq: Once | INTRAVENOUS | Status: AC
  Administered 2017-06-11: 19:00:00 80 mg via INTRAVENOUS
  Filled 2017-06-11: qty 80

## 2017-06-11 MED ORDER — SODIUM CHLORIDE 0.9 % IV SOLN
8.0000 mg/h | INTRAVENOUS | Status: DC
  Administered 2017-06-11 – 2017-06-13 (×4): 8 mg/h via INTRAVENOUS
  Filled 2017-06-11 (×5): qty 80

## 2017-06-11 MED ORDER — POTASSIUM CHLORIDE IN NACL 20-0.9 MEQ/L-% IV SOLN
INTRAVENOUS | Status: DC
  Administered 2017-06-11 – 2017-06-13 (×4): via INTRAVENOUS
  Filled 2017-06-11 (×6): qty 1000

## 2017-06-11 MED ORDER — SULFAMETHOXAZOLE-TRIMETHOPRIM 800-160 MG PO TABS
1.0000 | ORAL_TABLET | Freq: Once | ORAL | Status: AC
  Administered 2017-06-11: 1 via ORAL
  Filled 2017-06-11: qty 1

## 2017-06-11 MED ORDER — SODIUM CHLORIDE 0.9 % IV BOLUS
1000.0000 mL | Freq: Once | INTRAVENOUS | Status: AC
  Administered 2017-06-11: 1000 mL via INTRAVENOUS

## 2017-06-11 MED ORDER — LORAZEPAM 0.5 MG PO TABS
0.5000 mg | ORAL_TABLET | Freq: Two times a day (BID) | ORAL | Status: DC | PRN
  Administered 2017-06-11 – 2017-06-12 (×2): 0.5 mg via ORAL
  Filled 2017-06-11 (×2): qty 1

## 2017-06-11 MED ORDER — SENNOSIDES-DOCUSATE SODIUM 8.6-50 MG PO TABS: 1.0000 | ORAL_TABLET | Freq: Every evening | ORAL | Status: DC | PRN

## 2017-06-11 MED ORDER — PANTOPRAZOLE SODIUM 40 MG IV SOLR: 40.0000 mg | Freq: Two times a day (BID) | INTRAVENOUS | Status: DC

## 2017-06-11 NOTE — ED Provider Notes (Signed)
[facility] Emergency Department Provider Note  ____________________________________________  Time seen: Approximately 6:53 PM  I have reviewed the triage vital signs and the nursing notes.   HISTORY  Chief Complaint Hematemesis    HPI Gwendolyn Gonzales is a 32 y.o. female with history of polysubstance abuse, tobacco abuse, presenting for hematemesis, blood in stool, cough, prolonged menstrual cycle.  The patient reports that she takes 20 packs of BC powders per week.  2 days ago, she developed nausea and vomiting and has had 2 episodes of "bright frothy blood."  She also chronically has constipation with straining, and has noted some blood in her stool although she is not sure if she is having blood in her stool.  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  She has had no lightheadedness or syncope.  She also reports that she has been having a cough for several weeks without any fever, sore throat, ear pain, congestion or rhinorrhea.  She has had a menstrual cycle which has been lasting 2 weeks, which is abnormal for her.   History reviewed. No pertinent past medical history.  Patient Active Problem List   Diagnosis Date Noted  . 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 Methamphetamine use disorder, severe (HCC) 05/29/2016  . Tobacco use disorder 05/29/2016  . Cannabis use disorder, moderate, dependence (HCC) 05/29/2016    Past Surgical History:  Procedure Laterality Date  . CESAREAN SECTION    . CESAREAN SECTION  15 May 2006  . FLEXIBLE BRONCHOSCOPY W/ UPPER ENDOSCOPY    . INNER EAR SURGERY      Current Outpatient Rx  . Order #: [id number] Class: Print  . Order #: [id number] Class: Print  . Order #: [id number] Class: Print  . Order #: [id number] Class: Print  . Order #: [id number] Class: Print     Allergies Lactose intolerance (gi); Augmentin [amoxicillin-pot clavulanate]; and Biaxin [clarithromycin]  No family history on file.  Social History Social History   Tobacco Use  . Smoking status: Current Every Day Smoker    Packs/day: 0.25    Types: Cigarettes  . Smokeless tobacco: Former Engineer, water Use Topics  . Alcohol use: Yes    Alcohol/week: 0.6 oz    Types: 1 Cans of beer per week    Comment: occasional  . Drug use: No    Types: Methamphetamines, Marijuana    Comment: patient states, "I've been off of it a month and a half"    Review of Systems Constitutional: No fever/chills.  No lightheadedness or syncope. Eyes: No visual changes. ENT: No sore throat. No congestion or rhinorrhea. Cardiovascular: Denies chest pain. Denies palpitations. Respiratory: Denies shortness of breath.  Positive nonproductive cough. Gastrointestinal: No abdominal pain.  Positive nausea and vomiting with hematemesis.  No diarrhea.  Positive constipation with blood streaks. Genitourinary: Negative for dysuria.  Positive condyloma.  Positive prolonged menstrual cycle.   Musculoskeletal: Negative for back pain. Skin: Negative for rash. Neurological: Negative for headaches. No focal numbness, tingling or weakness.     ____________________________________________   PHYSICAL EXAM:  VITAL SIGNS: ED Triage Vitals [06/11/17 1623]  Enc Vitals Group     BP (!) 127/96     Pulse Rate 91     Resp 16     Temp 98.5 F (36.9 C)     Temp Source Oral     SpO2 96 %  Weight 131 lb (59.4 kg)     Height 4\' 9"  (1.448 m)     Head Circumference      Peak Flow      Pain Score 0     Pain Loc      Pain Edu?      Excl. in GC?     Constitutional: Alert and oriented.  Answers questions appropriately.  The patient is in no acute distress. Eyes: Conjunctivae are normal and without pallor.  EOMI. No scleral icterus. Head: Atraumatic. Nose: No congestion/rhinnorhea. Mouth/Throat: Mucous  membranes are moist.  Neck: No stridor.  Supple.  No JVD.  No meningismus. Cardiovascular: Normal rate, regular rhythm. No murmurs, rubs or gallops.  Respiratory: Normal respiratory effort.  No accessory muscle use or retractions. Lungs CTAB.  No wheezes, rales or ronchi. Gastrointestinal: Soft, nontender and nondistended.  No guarding or rebound.  No peritoneal signs. Genitourinary: No evidence of external hemorrhoids.  The patient has multiple skin colored masses that are consistent with condyloma.  No palpable internal hemorrhoids.  The stool is brown but guaiac positive. Musculoskeletal: No LE edema. Neurologic:  A&Ox3.  Speech is clear.  Face and smile are symmetric.  EOMI.  Moves all extremities well. Skin:  Skin is warm, dry.  See above for anal abnormalities  psychiatric: Mood and affect are normal. Speech and behavior are normal.  Normal judgement.  ____________________________________________   LABS (all labs ordered are listed, but only abnormal results are displayed)  Labs Reviewed  COMPREHENSIVE METABOLIC PANEL - Abnormal; Notable for the following components:      Result Value   Calcium 8.5 (*)    All other components within normal limits  URINALYSIS, COMPLETE (UACMP) WITH MICROSCOPIC - Abnormal; Notable for the following components:   Color, Urine AMBER (*)    APPearance CLOUDY (*)    Specific Gravity, Urine 1.031 (*)    Hgb urine dipstick LARGE (*)    Protein, ur 100 (*)    Bacteria, UA RARE (*)    Squamous Epithelial / LPF 6-30 (*)    All other components within normal limits  LIPASE, BLOOD  CBC  PREGNANCY, URINE  PROTIME-INR  APTT  INFLUENZA PANEL BY PCR (TYPE A & B)  TYPE AND SCREEN   ____________________________________________  EKG  Not indicated ____________________________________________  RADIOLOGY  No results found.  ____________________________________________   PROCEDURES  Procedure(s) performed: None  Procedures  Critical Care  performed: No ____________________________________________   INITIAL IMPRESSION / ASSESSMENT AND PLAN / ED COURSE  Pertinent labs & imaging results that were available during my care of the patient were reviewed by me and considered in my medical decision making (see chart for details).  32 y.o. female with over use of BC powders presenting with hematemesis and dark stool, though she has brown stool on my exam it is guaiac positive.  The patient is hemodynamically stable and has reassuring laboratory studies including a normal white blood cell count and normal hemoglobin and hematocrit.  However, I am concerned about the possibility of upper GI bleed, and the patient does have evidence of acute bleed.  She will be admitted to the hospital for continued evaluation and treatment at this time.  ____________________________________________  FINAL CLINICAL IMPRESSION(S) / ED DIAGNOSES  Final diagnoses:  Hematemesis with nausea  Gastrointestinal hemorrhage, unspecified gastrointestinal hemorrhage type  Cough  Abnormal uterine bleeding         NEW MEDICATIONS STARTED DURING THIS VISIT:  New Prescriptions   No medications on  file      Rockne MenghiniNorman, Anne-Caroline, MD 06/11/17 1859

## 2017-06-11 NOTE — Progress Notes (Signed)
Chaplain met with the patient and provided basis education of the AD. Patient will read over the material and fill out sections A & B. Chaplain will be paged when the patient is ready to complete the AD on 06/12/2017, after medical testing has concluded.

## 2017-06-11 NOTE — ED Triage Notes (Signed)
Pt to ED via POV, pt states that she has been vomiting blood x 2 days, pt has also been having dark stools, abdominal pain for the past month. Pt states that she was supposed to stop her period 2 weeks ago but she is still bleeding. Pt in NAD at this time.

## 2017-06-11 NOTE — H&P (Signed)
[employer] - [place] [facility]   PATIENT NAME: Gwendolyn Gonzales    MR#:  [id number]  DATE OF BIRTH:  [birth year]  DATE OF ADMISSION:  06/11/2017  PRIMARY CARE PHYSICIAN: Federico Flake, MD   REQUESTING/REFERRING PHYSICIAN: Dr. Sharma Covert.  CHIEF COMPLAINT:   Chief Complaint  Patient presents with  . Hematemesis    HISTORY OF PRESENT ILLNESS:  Gwendolyn Gonzales  is a 32 y.o. female with a known history of polysubstance abuse, alcohol abuse and drug abuse.  The patient has been taking BC powder for 10 years.  She reports that she took 20 packs of BC powder per week.  She has had nausea, vomiting and hematemesis twice for the past 1-2 days.  The hematemesis is fresh blood about 2-3 cups per patient.  She also complains of a cough several weeks but no fever or chills.  Her hemoglobin is normal.  Dr. Sharma Covert has concerns about GI bleeding and requested admission.  The patient said that she has history of alcohol abuse but he quit alcohol abuse 1 year ago.  PAST MEDICAL HISTORY:  History reviewed. No pertinent past medical history.  No past medical history.  PAST SURGICAL HISTORY:   Past Surgical History:  Procedure Laterality Date  . CESAREAN SECTION    . CESAREAN SECTION  15 May 2006  . FLEXIBLE BRONCHOSCOPY W/ UPPER ENDOSCOPY    . INNER EAR SURGERY      SOCIAL HISTORY:   Social History   Tobacco Use  . Smoking status: Current Every Day Smoker    Packs/day: 0.25    Types: Cigarettes  . Smokeless tobacco: Former Engineer, water Use Topics  . Alcohol use: Yes    Alcohol/week: 0.6 oz    Types: 1 Cans of beer per week    Comment: occasional    FAMILY HISTORY:  No family history on file.  No family history.  DRUG ALLERGIES:   Allergies  Allergen Reactions  . Lactose Intolerance (Gi) Nausea And Vomiting  . Augmentin [Amoxicillin-Pot Clavulanate]   . Biaxin [Clarithromycin]     REVIEW OF SYSTEMS:   Review of Systems  Constitutional:  Negative for chills, fever and malaise/fatigue.  HENT: Negative for sore throat.   Eyes: Negative for blurred vision and double vision.  Respiratory: Positive for cough. Negative for hemoptysis, shortness of breath, wheezing and stridor.   Cardiovascular: Negative for chest pain, palpitations, orthopnea and leg swelling.  Gastrointestinal: Positive for nausea and vomiting. Negative for abdominal pain, blood in stool, diarrhea and melena.       Hematemesis  Genitourinary: Negative for dysuria, flank pain and hematuria.  Musculoskeletal: Negative for back pain and joint pain.  Skin: Negative for rash.  Neurological: Negative for dizziness, sensory change, focal weakness, seizures, loss of consciousness, weakness and headaches.  Endo/Heme/Allergies: Negative for polydipsia.  Psychiatric/Behavioral: Negative for depression. The patient is not nervous/anxious.     MEDICATIONS AT HOME:   Prior to Admission medications   Medication Sig Start Date End Date Taking? Authorizing Provider  Aspirin-Caffeine (BC FAST PAIN RELIEF PO) Take 50 packets by mouth once a week.   Yes [provider]  medroxyPROGESTERone (DEPO-PROVERA) 150 MG/ML injection Inject 150 mg into the muscle every 3 (three) months.   Yes [provider]  OLANZapine zydis (ZYPREXA) 5 MG disintegrating tablet Take 1 tablet (5 mg total) by mouth at bedtime. Patient not taking: Reported on 06/11/2017 06/01/16   Jimmy Footman, MD      VITAL  SIGNS:  Blood pressure 111/83, pulse 82, temperature 98.5 F (36.9 C), temperature source Oral, resp. rate 18, height 4\' 9"  (1.448 m), weight 131 lb (59.4 kg), last menstrual period 05/18/2017, SpO2 98 %, unknown if currently breastfeeding.  PHYSICAL EXAMINATION:  Physical Exam  GENERAL:  32 y.o.-year-old patient lying in the bed with no acute distress.  EYES: Pupils equal, round, reactive to light and accommodation. No scleral icterus. Extraocular muscles intact.    HEENT: Head atraumatic, normocephalic.  Mild erythema on oropharynx. NECK:  Supple, no jugular venous distention. No thyroid enlargement, no tenderness.  LUNGS: Normal breath sounds bilaterally, no wheezing, rales,rhonchi or crepitation. No use of accessory muscles of respiration.  CARDIOVASCULAR: S1, S2 normal. No murmurs, rubs, or gallops.  ABDOMEN: Soft, tenderness in the middle part of the abdomen, nondistended. Bowel sounds present. No organomegaly or mass.  EXTREMITIES: No pedal edema, cyanosis, or clubbing.  NEUROLOGIC: Cranial nerves II through XII are intact. Muscle strength 5/5 in all extremities. Sensation intact. Gait not checked.  PSYCHIATRIC: The patient is alert and oriented x 3.  SKIN: No obvious rash, lesion, or ulcer.   LABORATORY PANEL:   CBC Recent Labs  Lab 06/11/17 1627  WBC 6.8  HGB 15.3  HCT 46.4  PLT 207   ------------------------------------------------------------------------------------------------------------------  Chemistries  Recent Labs  Lab 06/11/17 1627  NA 139  K 3.6  CL 108  CO2 22  GLUCOSE 88  BUN 13  CREATININE 0.79  CALCIUM 8.5*  AST 24  ALT 19  ALKPHOS 65  BILITOT 0.5   ------------------------------------------------------------------------------------------------------------------  Cardiac Enzymes No results for input(s): TROPONINI in the last 168 hours. ------------------------------------------------------------------------------------------------------------------  RADIOLOGY:  Dg Chest 2 View  Result Date: 06/11/2017 CLINICAL DATA:  Cough EXAM: CHEST - 2 VIEW COMPARISON:  04/30/2014 FINDINGS: Mild bronchitic changes. No consolidation or effusion. Normal heart size. No pneumothorax. Bilateral nipple piercing IMPRESSION: Bronchitic changes.  No focal pulmonary infiltrate. Electronically Signed   By: Jasmine PangKim  Fujinaga M.D.   On: 06/11/2017 19:24      IMPRESSION AND PLAN:   Upper GI bleeding with hematemesis The patient  will be placed for observation. Continue Protonix IV, follow hemoglobin every 6 hours, GI consult. The patient was advised to avoid NSAIDs including BC powder.  Tobacco abuse.  Smoking cessation was counseled for 4 minutes, nicotine patch.  History of alcohol abuse. All the records are reviewed and case discussed with ED provider. Management plans discussed with the patient, family and they are in agreement.  CODE STATUS: Full code  TOTAL TIME TAKING CARE OF THIS PATIENT: 48 minutes.    Shaune PollackQing Cooper Stamp M.D on 06/11/2017 at 7:55 PM  Between 7am to 6pm - Pager - [phone]  After 6pm go to [email or web address] - Social research officer, governmentpassword [email or web address]  [facility]  Office  [phone]  CC: Primary care physician; Federico FlakeNewton, Kimberly Niles, MD   Note: This dictation was prepared with Dragon dictation along with smaller phrase technology. Any transcriptional errors that result from this process are unin

## 2017-06-11 NOTE — Progress Notes (Signed)
Notified MD that pt with increased anxiety, doesn't want lab to stick her right now, orders taken

## 2017-06-11 NOTE — ED Notes (Signed)
Patient transported to room 212 by this EDT.

## 2017-06-12 DIAGNOSIS — K921 Melena: Secondary | ICD-10-CM

## 2017-06-12 LAB — BASIC METABOLIC PANEL
ANION GAP: 7 (ref 5–15)
Anion gap: 6 (ref 5–15)
BUN: 10 mg/dL (ref 6–20)
BUN: 6 mg/dL (ref 6–20)
CALCIUM: 7.6 mg/dL — AB (ref 8.9–10.3)
CALCIUM: 7.9 mg/dL — AB (ref 8.9–10.3)
CHLORIDE: 114 mmol/L — AB (ref 101–111)
CO2: 19 mmol/L — AB (ref 22–32)
CO2: 21 mmol/L — ABNORMAL LOW (ref 22–32)
CREATININE: 0.7 mg/dL (ref 0.44–1.00)
Chloride: 112 mmol/L — ABNORMAL HIGH (ref 101–111)
Creatinine, Ser: 0.61 mg/dL (ref 0.44–1.00)
GFR calc non Af Amer: >60 mL/min (ref >60)
Glucose, Bld: 89 mg/dL (ref 65–99)
Glucose, Bld: 110 mg/dL — ABNORMAL HIGH (ref 65–99)
Potassium: 3.6 mmol/L (ref 3.5–5.1)
Potassium: 3.9 mmol/L (ref 3.5–5.1)
SODIUM: 139 mmol/L (ref 135–145)
Sodium: 140 mmol/L (ref 135–145)

## 2017-06-12 LAB — CBC
HCT: 39.5 % (ref 35.0–47.0)
HEMOGLOBIN: 13.2 g/dL (ref 12.0–16.0)
MCH: 29.9 pg (ref 26.0–34.0)
MCHC: 33.5 g/dL (ref 32.0–36.0)
MCV: 89.5 fL (ref 80.0–100.0)
Platelets: 151 10*3/uL (ref 150–440)
RBC: 4.42 MIL/uL (ref 3.80–5.20)
RDW: 13.6 % (ref 11.5–14.5)
WBC: 4.7 10*3/uL (ref 3.6–11.0)

## 2017-06-12 LAB — URINE DRUG SCREEN, QUALITATIVE (ARMC ONLY)
Amphetamines, Ur Screen: NOT DETECTED
BENZODIAZEPINE, UR SCRN: NOT DETECTED
Barbiturates, Ur Screen: NOT DETECTED
CANNABINOID 50 NG, UR ~~LOC~~: POSITIVE — AB
Cocaine Metabolite,Ur ~~LOC~~: POSITIVE — AB
MDMA (Ecstasy)Ur Screen: NOT DETECTED
Methadone Scn, Ur: NOT DETECTED
Opiate, Ur Screen: POSITIVE — AB
PHENCYCLIDINE (PCP) UR S: NOT DETECTED
TRICYCLIC, UR SCREEN: NOT DETECTED

## 2017-06-12 NOTE — Progress Notes (Signed)
Urine drug screen shows cocaine in urine. Cannot do EGD at this time.   Will cancel procedure for tomorrow.    Dr Wyline MoodKiran Dyneshia Baccam MD,MRCP [facility] Gastroenterology/Hepatology Pager: [phone]

## 2017-06-12 NOTE — Consult Note (Signed)
Gwendolyn MoodKiran Robb Gonzales , MD [address], [address], [place], [place], [id number] [address], [place], [place], [address] Phone: [phone]  Fax: [phone]  Consultation  Referring Provider: Dr Allena KatzPatel  Primary Care Physician:  Federico FlakeNewton, Kimberly Niles, MD Primary Gastroenterologist: None          Reason for Consultation:     GI bleed   Date of Admission:  06/11/2017 Date of Consultation:  06/12/2017         HPI:   Gwendolyn PickettStephanie [name] Gonzales is a 32 y.o. female with a history of poly substance abuse , lomg term use of BC power > 10 years , came in to the hospital with a complaint of throwing up blood for past 2 days .Quit drinking alcohol in excess 1 year back.  She says 6 days back threw up bright red blood for 6 hours at that time she had black colored stools but none since neither a bowel movement or throwing up blood. No abdominal pain. Uses THC,denies any cocaine.    CBC Latest Ref Rng & Units 06/12/2017 06/11/2017 02/09/2017  WBC 3.6 - 11.0 K/uL 4.7 6.8 7.6  Hemoglobin 12.0 - 16.0 g/dL 57.813.2 46.915.3 62.914.7  Hematocrit 35.0 - 47.0 % 39.5 46.4 42.5  Platelets 150 - 440 K/uL 151 207 225     History reviewed. No pertinent past medical history.  Past Surgical History:  Procedure Laterality Date  . CESAREAN SECTION    . CESAREAN SECTION  15 May 2006  . FLEXIBLE BRONCHOSCOPY W/ UPPER ENDOSCOPY    . INNER EAR SURGERY      Prior to Admission medications   Medication Sig Start Date End Date Taking? Authorizing Provider  Aspirin-Caffeine (BC FAST PAIN RELIEF PO) Take 50 packets by mouth once a week.   Yes [provider]  medroxyPROGESTERone (DEPO-PROVERA) 150 MG/ML injection Inject 150 mg into the muscle every 3 (three) months.   Yes [provider]  OLANZapine zydis (ZYPREXA) 5 MG disintegrating tablet Take 1 tablet (5 mg total) by mouth at bedtime. Patient not taking: Reported on 06/11/2017 06/01/16   Jimmy FootmanHernandez-Gonzalez, Andrea, MD    History reviewed. No  pertinent family history.   Social History   Tobacco Use  . Smoking status: Current Every Day Smoker    Packs/day: 0.25    Types: Cigarettes  . Smokeless tobacco: Former Engineer, waterUser  Substance Use Topics  . Alcohol use: Yes    Alcohol/week: 0.6 oz    Types: 1 Cans of beer per week    Comment: occasional  . Drug use: No    Types: Methamphetamines, Marijuana    Comment: patient states, "I've been off of it a month and a half"    Allergies as of 06/11/2017 - Review Complete 06/11/2017  Allergen Reaction Noted  . Lactose intolerance (gi) Nausea And Vomiting 05/28/2016  . Augmentin [amoxicillin-pot clavulanate]  05/02/2015  . Biaxin [clarithromycin]  05/02/2015    Review of Systems:    All systems reviewed and negative except where noted in HPI.   Physical Exam:  Vital signs in last 24 hours: Temp:  [97.6 F (36.4 C)-98.5 F (36.9 C)] 97.6 F (36.4 C) (03/30 0419) Pulse Rate:  [73-91] 76 (03/30 0419) Resp:  [16-20] 20 (03/30 0419) BP: (111-127)/(75-96) 111/75 (03/30 0419) SpO2:  [96 %-98 %] 98 % (03/30 0419) Weight:  [125 lb (56.7 kg)-131 lb (59.4 kg)] 125 lb (56.7 kg) (03/29 2051) Last BM Date: 06/08/17 General:   Pleasant, cooperative in NAD Head:  Normocephalic and atraumatic. Eyes:   No icterus.   Conjunctiva pink. PERRLA. Ears:  Normal auditory acuity. Neck:  Supple; no masses or thyroidomegaly Lungs: Respirations even and unlabored. Lungs clear to auscultation bilaterally.   No wheezes, crackles, or rhonchi.  Heart:  Regular rate and rhythm;  Without murmur, clicks, rubs or gallops Abdomen:  Soft, nondistended, nontender. Normal bowel sounds. No appreciable masses or hepatomegaly.  No rebound or guarding.  Neurologic:  Alert and oriented x3;  grossly normal neurologically. Skin:  Intact without significant lesions or rashes. Cervical Nodes:  No significant cervical adenopathy. Psych:  Alert and cooperative. Normal affect.  LAB RESULTS: Recent Labs    06/11/17 1627  06/12/17 0525  WBC 6.8 4.7  HGB 15.3 13.2  HCT 46.4 39.5  PLT 207 151   BMET Recent Labs    06/11/17 1627 06/12/17 0525  NA 139 140  K 3.6 3.6  CL 108 114*  CO2 22 19*  GLUCOSE 88 89  BUN 13 10  CREATININE 0.79 0.61  CALCIUM 8.5* 7.6*   LFT Recent Labs    06/11/17 1627  PROT 7.2  ALBUMIN 4.0  AST 24  ALT 19  ALKPHOS 65  BILITOT 0.5   PT/INR Recent Labs    06/11/17 1627  LABPROT 12.7  INR 0.96    STUDIES: Dg Chest 2 View  Result Date: 06/11/2017 CLINICAL DATA:  Cough EXAM: CHEST - 2 VIEW COMPARISON:  04/30/2014 FINDINGS: Mild bronchitic changes. No consolidation or effusion. Normal heart size. No pneumothorax. Bilateral nipple piercing IMPRESSION: Bronchitic changes.  No focal pulmonary infiltrate. Electronically Signed   By: Jasmine Pang M.D.   On: 06/11/2017 19:24      Impression / Plan:   Gwendolyn Gonzales is a 32 y.o. y/o female with history of polyp substance abuse, admitted with hematemesis. HB at normal range no elevation of BUN/Cr ratio.   Plan  1. NPO from midnight  2. Check urine for drugs and if negative plan to perform EGD tomorrow morning. Orders for EGD placed at 10.54 am  3. PPI 4. Stop NSAID use.    I have discussed alternative options, risks & benefits,  which include, but are not limited to, bleeding, infection, perforation,respiratory complication & drug reaction.  The patient agrees with this plan & written consent will be obtained.    Thank you for involving me in the care of this patient.      LOS: 0 days   Gwendolyn Mood, MD  06/12/2017, 10:49 AM

## 2017-06-12 NOTE — Progress Notes (Signed)
[facility] - [place] at [facility]                                                                                                                                                                                  Patient Demographics   Gwendolyn Gonzales, is a 32 y.o. female, DOB - 09/03/1985, UJW:[id number]  Admit date - 06/11/2017   Admitting Physician Shaune PollackQing Chen, MD  Outpatient Primary MD for the patient is Federico FlakeNewton, Kimberly Niles, MD   LOS - 0  Subjective: Patient admitted with a GI bleed states no further throwing up of blood complains of some epigastric pain    Review of Systems:   CONSTITUTIONAL: No documented fever. No fatigue, weakness. No weight gain, no weight loss.  EYES: No blurry or double vision.  ENT: No tinnitus. No postnasal drip. No redness of the oropharynx.  RESPIRATORY: No cough, no wheeze, no hemoptysis. No dyspnea.  CARDIOVASCULAR: No chest pain. No orthopnea. No palpitations. No syncope.  GASTROINTESTINAL: No nausea, no vomiting or diarrhea.  Positive abdominal pain. No melena or hematochezia.  GENITOURINARY: No dysuria or hematuria.  ENDOCRINE: No polyuria or nocturia. No heat or cold intolerance.  HEMATOLOGY: No anemia. No bruising. No bleeding.  INTEGUMENTARY: No rashes. No lesions.  MUSCULOSKELETAL: No arthritis. No swelling. No gout.  NEUROLOGIC: No numbness, tingling, or ataxia. No seizure-type activity.  PSYCHIATRIC: No anxiety. No insomnia. No ADD.    Vitals:   Vitals:   06/11/17 2051 06/12/17 0419 06/12/17 1238 06/12/17 1241  BP:  111/75 (!) 86/52 113/73  Pulse:  76 65 (!) 53  Resp:  20 18   Temp:  97.6 F (36.4 C) 98.1 F (36.7 C)   TempSrc:  Oral Oral   SpO2:  98% 98%   Weight: 125 lb (56.7 kg)     Height: 4\' 9"  (1.448 m)       Wt Readings from Last 3 Encounters:  06/11/17 125 lb (56.7 kg)  02/09/17 131 lb (59.4 kg)  01/12/17 135 lb (61.2 kg)     Intake/Output Summary (Last 24 hours) at 06/12/2017 1332 Last data  filed at 06/12/2017 1100 Gross per 24 hour  Intake 783 ml  Output 350 ml  Net 433 ml    Physical Exam:   GENERAL: Pleasant-appearing in no apparent distress.  HEAD, EYES, EARS, NOSE AND THROAT: Atraumatic, normocephalic. Extraocular muscles are intact. Pupils equal and reactive to light. Sclerae anicteric. No conjunctival injection. No oro-pharyngeal erythema.  NECK: Supple. There is no jugular venous distention. No bruits, no lymphadenopathy, no thyromegaly.  HEART: Regular rate and rhythm,. No murmurs, no rubs, no clicks.  LUNGS: Clear to auscultation bilaterally. No rales or  rhonchi. No wheezes.  ABDOMEN: Soft, flat, nontender, nondistended. Has good bowel sounds. No hepatosplenomegaly appreciated.  EXTREMITIES: No evidence of any cyanosis, clubbing, or peripheral edema.  +2 pedal and radial pulses bilaterally.  NEUROLOGIC: The patient is alert, awake, and oriented x3 with no focal motor or sensory deficits appreciated bilaterally.  SKIN: Moist and warm with no rashes appreciated.  Psych: Not anxious, depressed LN: No inguinal LN enlargement    Antibiotics   Anti-infectives (From admission, onward)   Start     Dose/Rate Route Frequency Ordered Stop   06/11/17 1915  sulfamethoxazole-trimethoprim (BACTRIM DS,SEPTRA DS) 800-160 MG per tablet 1 tablet     1 tablet Oral  Once 06/11/17 1905 06/11/17 1926      Medications   Scheduled Meds: . nicotine  14 mg Transdermal Daily  . [START ON 06/15/2017] pantoprazole  40 mg Intravenous Q12H   Continuous Infusions: . 0.9 % NaCl with KCl 20 mEq / L 100 mL/hr at 06/12/17 0625  . pantoprozole (PROTONIX) infusion 8 mg/hr (06/12/17 0922)   PRN Meds:.acetaminophen **OR** acetaminophen, albuterol, HYDROcodone-acetaminophen, LORazepam, ondansetron **OR** ondansetron (ZOFRAN) IV, senna-docusate   Data Review:   Micro Results No results found for this or any previous visit (from the past 240 hour(s)).  Radiology Reports Dg Chest 2  View  Result Date: 06/11/2017 CLINICAL DATA:  Cough EXAM: CHEST - 2 VIEW COMPARISON:  04/30/2014 FINDINGS: Mild bronchitic changes. No consolidation or effusion. Normal heart size. No pneumothorax. Bilateral nipple piercing IMPRESSION: Bronchitic changes.  No focal pulmonary infiltrate. Electronically Signed   By: Jasmine Pang M.D.   On: 06/11/2017 19:24     CBC Recent Labs  Lab 06/11/17 1627 06/12/17 0525  WBC 6.8 4.7  HGB 15.3 13.2  HCT 46.4 39.5  PLT 207 151  MCV 90.5 89.5  MCH 29.9 29.9  MCHC 33.1 33.5  RDW 14.0 13.6    Chemistries  Recent Labs  Lab 06/11/17 1627 06/12/17 0525  NA 139 140  K 3.6 3.6  CL 108 114*  CO2 22 19*  GLUCOSE 88 89  BUN 13 10  CREATININE 0.79 0.61  CALCIUM 8.5* 7.6*  AST 24  --   ALT 19  --   ALKPHOS 65  --   BILITOT 0.5  --    ------------------------------------------------------------------------------------------------------------------ estimated creatinine clearance is 74.3 mL/min (by C-G formula based on SCr of 0.61 mg/dL). ------------------------------------------------------------------------------------------------------------------ No results for input(s): HGBA1C in the last 72 hours. ------------------------------------------------------------------------------------------------------------------ No results for input(s): CHOL, HDL, LDLCALC, TRIG, CHOLHDL, LDLDIRECT in the last 72 hours. ------------------------------------------------------------------------------------------------------------------ No results for input(s): TSH, T4TOTAL, T3FREE, THYROIDAB in the last 72 hours.  Invalid input(s): FREET3 ------------------------------------------------------------------------------------------------------------------ No results for input(s): VITAMINB12, FOLATE, FERRITIN, TIBC, IRON, RETICCTPCT in the last 72 hours.  Coagulation profile Recent Labs  Lab 06/11/17 1627  INR 0.96    No results for input(s): DDIMER in the  last 72 hours.  Cardiac Enzymes No results for input(s): CKMB, TROPONINI, MYOGLOBIN in the last 168 hours.  Invalid input(s): CK ------------------------------------------------------------------------------------------------------------------ Invalid input(s): POCBNP    Assessment & Plan  Patient's 32 year old white female admitted with abdominal pain and upper GI bleed takes Goody powders on a regular basis  #1 upper GI bleeding with hematemesis Continue IV Protonix Repeat CBC in the morning I discussed with GI they were planning to do EGD tomorrow however she is noted to have positive cocaine in her urine so they will not be able to perform this procedure So if her hemoglobin is stable she will  be able to be discharged tomorrow and outpatient follow-up  #2 Tobacco abuse.  Smoking cessation was provided to the patient  #3 history of alcohol abuse. No evidence of withdrawal      Code Status Orders  (From admission, onward)        Start     Ordered   06/11/17 2028  Full code  Continuous     06/11/17 2027    Code Status History    Date Active Date Inactive Code Status Order ID Comments User Context   05/28/2016 2213 06/01/2016 1519 Full Code [id number]  Clapacs, Jackquline Denmark, MD Inpatient           Consults gastroenterology  DVT Prophylaxis SCDs  Lab Results  Component Value Date   PLT 151 06/12/2017     Time Spent in minutes   35 minutes greater than 50% of time spent in care coordination and counseling patient regarding the condition and plan of care.   Auburn Bilberry M.D on 06/12/2017 at 1:32 PM  Between 7am to 6pm - Pager - [phone]  After 6pm go to [email or web address] - Social research officer, government  [facility]   Office  [phone]

## 2017-06-13 ENCOUNTER — Encounter
Admission: EM | Disposition: A | Discharge status: Home / Self Care | Payer: Self-pay | Source: Home / Self Care | Attending: Emergency Medicine

## 2017-06-13 LAB — CBC
HCT: 38.3 % (ref 35.0–47.0)
HEMOGLOBIN: 12.8 g/dL (ref 12.0–16.0)
MCH: 29.8 pg (ref 26.0–34.0)
MCHC: 33.5 g/dL (ref 32.0–36.0)
MCV: 88.9 fL (ref 80.0–100.0)
Platelets: 158 10*3/uL (ref 150–440)
RBC: 4.31 MIL/uL (ref 3.80–5.20)
RDW: 13.7 % (ref 11.5–14.5)
WBC: 5 10*3/uL (ref 3.6–11.0)

## 2017-06-13 LAB — HIV ANTIBODY (ROUTINE TESTING W REFLEX): HIV SCREEN 4TH GENERATION: NONREACTIVE

## 2017-06-13 SURGERY — ESOPHAGOGASTRODUODENOSCOPY (EGD) WITH PROPOFOL
Anesthesia: General

## 2017-06-13 MED ORDER — ACETAMINOPHEN 325 MG PO TABS: 650.0000 mg | ORAL_TABLET | Freq: Four times a day (QID) | ORAL | Status: AC | PRN

## 2017-06-13 MED ORDER — PANTOPRAZOLE SODIUM 40 MG PO TBEC: 40.0000 mg | DELAYED_RELEASE_TABLET | Freq: Every day | ORAL | 1 refills | Status: DC

## 2017-06-13 NOTE — Discharge Summary (Signed)
[facility] - [place] [facility]  Gwendolyn Gonzales, [place]31 y.o., DOB 1985/08/04, MRN [id number]. Admission date: 06/11/2017 Discharge Date 06/13/2017 Primary MD Federico FlakeNewton, Kimberly Niles, MD Admitting Physician Shaune PollackQing Chen, MD  Admission Diagnosis  Cough [R05] Abnormal uterine bleeding [N93.9] Acute cystitis without hematuria [N30.00] Gastrointestinal hemorrhage, unspecified gastrointestinal hemorrhage type [K92.2] Hematemesis with nausea [K92.0]  Discharge Diagnosis   Active Problems: Likely upper GI bleed due to [facility] powder use Tobacco abuse History of alcohol abuse      Hospital Course  Patient is 32 year old white female who presented with complaint of hematemesis and melena.  Patient reported that she takes [facility] powders on a regular basis.  Her hemoglobin was stable on admission.  With IV hydration it did drop in the 12 range.  Patient had no further episodes of hematemesis.  Because her toxic urine drug screen was positive for cocaine  GI was unable to do an endoscopy.  She is recommended to follow-up outpatient to have the endoscopy done .  She is also recommended to stop taking BC powders.             Consults  GI  Significant Tests:  See full reports for all details    Dg Chest 2 View  Result Date: 06/11/2017 CLINICAL DATA:  Cough EXAM: CHEST - 2 VIEW COMPARISON:  04/30/2014 FINDINGS: Mild bronchitic changes. No consolidation or effusion. Normal heart size. No pneumothorax. Bilateral nipple piercing IMPRESSION: Bronchitic changes.  No focal pulmonary infiltrate. Electronically Signed   By: Jasmine PangKim  Fujinaga M.D.   On: 06/11/2017 19:24       Today   Subjective:   Gwendolyn Gonzales not having any further bleeding wants to go home Objective:   Blood pressure 107/88, pulse (!) 59, temperature 98.2 F (36.8 C), temperature source Oral, resp. rate 16, height 4\' 9"  (1.448 m), weight 125 lb (56.7 kg), last menstrual period 05/18/2017, SpO2 100 %, unknown  if currently breastfeeding.  .  Intake/Output Summary (Last 24 hours) at 06/13/2017 1426 Last data filed at 06/13/2017 1355 Gross per 24 hour  Intake 2600 ml  Output 2700 ml  Net -100 ml    Exam VITAL SIGNS: Blood pressure 107/88, pulse (!) 59, temperature 98.2 F (36.8 C), temperature source Oral, resp. rate 16, height 4\' 9"  (1.448 m), weight 125 lb (56.7 kg), last menstrual period 05/18/2017, SpO2 100 %, unknown if currently breastfeeding.  GENERAL:  32 y.o.-year-old patient lying in the bed with no acute distress.  EYES: Pupils equal, round, reactive to light and accommodation. No scleral icterus. Extraocular muscles intact.  HEENT: Head atraumatic, normocephalic. Oropharynx and nasopharynx clear.  NECK:  Supple, no jugular venous distention. No thyroid enlargement, no tenderness.  LUNGS: Normal breath sounds bilaterally, no wheezing, rales,rhonchi or crepitation. No use of accessory muscles of respiration.  CARDIOVASCULAR: S1, S2 normal. No murmurs, rubs, or gallops.  ABDOMEN: Soft, nontender, nondistended. Bowel sounds present. No organomegaly or mass.  EXTREMITIES: No pedal edema, cyanosis, or clubbing.  NEUROLOGIC: Cranial nerves II through XII are intact. Muscle strength 5/5 in all extremities. Sensation intact. Gait not checked.  PSYCHIATRIC: The patient is alert and oriented x 3.  SKIN: No obvious rash, lesion, or ulcer.   Data Review     CBC w Diff:  Lab Results  Component Value Date   WBC 5.0 06/13/2017   HGB 12.8 06/13/2017   HGB 16.3 (H) 04/18/2012   HCT 38.3 06/13/2017   HCT 47.9 (H) 04/18/2012   PLT 158 06/13/2017  PLT 250 04/18/2012   CMP:  Lab Results  Component Value Date   NA 139 06/12/2017   NA 146 (H) 04/18/2012   K 3.9 06/12/2017   K 4.3 04/18/2012   CL 112 (H) 06/12/2017   CL 113 (H) 04/18/2012   CO2 21 (L) 06/12/2017   CO2 21 04/18/2012   BUN 6 06/12/2017   BUN 10 04/18/2012   CREATININE 0.70 06/12/2017   CREATININE 0.59 (L) 04/18/2012    PROT 7.2 06/11/2017   PROT 7.9 04/18/2012   ALBUMIN 4.0 06/11/2017   ALBUMIN 4.6 04/18/2012   BILITOT 0.5 06/11/2017   BILITOT 0.2 04/18/2012   ALKPHOS 65 06/11/2017   ALKPHOS 92 04/18/2012   AST 24 06/11/2017   AST 30 04/18/2012   ALT 19 06/11/2017   ALT 32 04/18/2012  .  Micro Results No results found for this or any previous visit (from the past 240 hour(s)).      Code Status Orders  (From admission, onward)        Start     Ordered   06/11/17 2028  Full code  Continuous     06/11/17 2027    Code Status History    Date Active Date Inactive Code Status Order ID Comments User Context   05/28/2016 2213 06/01/2016 1519 Full Code [id number]  Clapacs, Jackquline Denmark, MD Inpatient          Follow-up Information    Federico Flake, MD Follow up in 6 day(s).   Specialty:  Obstetrics and Gynecology Contact information: [address] [place] [address] [place] [address] [phone]        Wyline Mood, MD Follow up in 2 week(s).   Specialty:  Gastroenterology Why:  gi bleed Contact information: [address] [place] [address] [phone]           Discharge Medications   Allergies as of 06/13/2017      Reactions   Lactose Intolerance (gi) Nausea And Vomiting   Augmentin [amoxicillin-pot Clavulanate]    Biaxin [clarithromycin]       Medication List    STOP taking these medications   BC FAST PAIN RELIEF PO     TAKE these medications   acetaminophen 325 MG tablet Commonly known as:  TYLENOL Take 2 tablets (650 mg total) by mouth every 6 (six) hours as needed for mild pain (or Fever >/= 101).   medroxyPROGESTERone 150 MG/ML injection Commonly known as:  DEPO-PROVERA Inject 150 mg into the muscle every 3 (three) months.   OLANZapine zydis 5 MG disintegrating tablet Commonly known as:  ZYPREXA Take 1 tablet (5 mg total) by mouth at bedtime.   pantoprazole 40 MG tablet Commonly known as:  PROTONIX Take 1 tablet (40 mg  total) by mouth daily.          Total Time in preparing paper work, data evaluation and todays exam - 35 minutes  Auburn Bilberry M.D on 06/13/2017 at 2:26 PM [facility]   Office  [phone]

## 2017-06-13 NOTE — Progress Notes (Signed)
Nutrition Brief Note  Patient identified on the Malnutrition Screening Tool (MST) Report  32 year old white female with h/o substance abuse admitted with abdominal pain and upper GI bleed takes Goody powders on a regular basis  Met with pt in room today. Pt reports good appetite and oral intake pta. Pt advanced from clear liquids to a regular diet today and is being discharged after she eats. Pt does not appear malnourished on exam today. Per chart, pt appears weight stable.   Wt Readings from Last 15 Encounters:  06/11/17 125 lb (56.7 kg)  02/09/17 131 lb (59.4 kg)  01/12/17 135 lb (61.2 kg)  05/28/16 129 lb (58.5 kg)    Body mass index is 27.05 kg/m. Patient meets criteria for overweight based on current BMI.   Current diet order is regular, patient is consuming approximately 100% of meals at this time. Labs and medications reviewed.   No nutrition interventions warranted at this time. If nutrition issues arise, please consult RD.   Koleen Distance MS, RD, LDN Pager #- [phone] After Hours Pager: [phone]

## 2017-06-13 NOTE — Discharge Instructions (Signed)
[facility] - [place] at [facility] ° °DIET:  °Cardiac diet ° °DISCHARGE CONDITION:  °Stable ° °ACTIVITY:  °Activity as tolerated ° °OXYGEN:  °Home Oxygen: No. °  °Oxygen Delivery: room air ° °DISCHARGE LOCATION:  °home  ° ° °ADDITIONAL DISCHARGE INSTRUCTION: ° ° °If you experience worsening of your admission symptoms, develop shortness of breath, life threatening emergency, suicidal or homicidal thoughts you must seek medical attention immediately by calling 911 or calling your MD immediately  if symptoms less severe. ° °You Must read complete instructions/literature along with all the possible adverse reactions/side effects for all the Medicines you take and that have been prescribed to you. Take any new Medicines after you have completely understood and accpet all the possible adverse reactions/side effects.  ° °Please note ° °You were cared for by a hospitalist during your hospital stay. If you have any questions about your discharge medications or the care you received while you were in the hospital after you are discharged, you can call the unit and asked to speak with the hospitalist on call if the hospitalist that took care of you is not available. Once you are discharged, your primary care physician will handle any further medical issues. Please note that NO REFILLS for any discharge medications will be authorized once you are discharged, as it is imperative that you return to your primary care physician (or establish a relationship with a primary care physician if you do not have one) for your aftercare needs so that they can reassess your need for medications and monitor your lab values. ° ° °

## 2017-06-13 NOTE — Progress Notes (Signed)
Pt discharged per MD order. IVs removed. Discharge instructions reviewed with pt. Pt verbalized understanding and all questions answered to pt satisfaction. Pt taken downstairs in wheelchair by staff.

## 2017-06-23 ENCOUNTER — Ambulatory Visit
Admission: RE | Admit: 2017-06-23 | Discharge: 2017-06-23 | Disposition: A | Discharge status: Home / Self Care | Payer: Self-pay | Source: Ambulatory Visit | Attending: Oncology | Admitting: Oncology

## 2017-06-23 ENCOUNTER — Ambulatory Visit: Payer: Self-pay | Attending: Oncology

## 2017-06-23 VITALS — BP 120/81 | HR 94 | Temp 98.7°F | Ht 61.0 in | Wt 125.0 lb

## 2017-06-23 DIAGNOSIS — N63 Unspecified lump in unspecified breast: Secondary | ICD-10-CM

## 2017-06-23 NOTE — Progress Notes (Signed)
Subjective:     Patient ID: Gwendolyn Gonzales, female   DOB: 07/17/1985, 32 y.o.   MRN: [id number]  HPI   Review of Systems     Objective:   Physical Exam  Pulmonary/Chest: Right breast exhibits no inverted nipple, no mass, no nipple discharge, no skin change and no tenderness. Left breast exhibits mass. Left breast exhibits no inverted nipple, no nipple discharge, no skin change and no tenderness. Breasts are symmetrical.    .5cm firm breast nodule 11 o'clock left breast 5 cm from areola       Assessment:     32 [facility].  Patient screened, and meets [facility] eligibility.  Patient does not have insurance, Medicare or Medicaid.  Handout given on Affordable Care Act.  Instructed patient on breast self awareness using teach back method. Normal fibroglandular breast tissue palpated.   Palpated a firm, mobile nodule at 11oclock left breast.       Recent Vital Signs /Orders  BP 120/81 (BP Location: Right Arm, Patient Position: Sitting, Cuff Size: Normal)   Pulse 94   Temp 98.7 F (37.1 C) (Oral)        Past Medical & Surgical History  No past medical history on file.   Past Surgical History:  Procedure Laterality Date  . CESAREAN SECTION    . CESAREAN SECTION  15 May 2006  . FLEXIBLE BRONCHOSCOPY W/ UPPER ENDOSCOPY    . INNER EAR SURGERY       Expected Discharge Date     Diet  No diet orders on file   VTE Documentation      Work Intensity Score     Mobility     Consult Orders      CBG Orders     Abnormal Labs    Blima Singernn Shaver 06/23/2017, 1:33 PM cm    Plan:     Sent for bilateral diagnostic mammogram and ultrasound.

## 2017-06-25 NOTE — Progress Notes (Signed)
Letter mailed from [facility] to notify of normal mammogram results.  Patient to return at age 32 for annual screening.  Copy to [facility]. 

## 2017-06-29 ENCOUNTER — Ambulatory Visit: Payer: Self-pay | Admitting: Gastroenterology

## 2017-07-22 ENCOUNTER — Ambulatory Visit: Payer: Self-pay | Admitting: Gastroenterology

## 2017-09-06 ENCOUNTER — Encounter: Payer: Self-pay | Admitting: *Deleted

## 2017-09-06 ENCOUNTER — Ambulatory Visit: Payer: Self-pay | Admitting: Gastroenterology

## 2017-09-06 NOTE — Progress Notes (Deleted)
She has been referred for a "GI bleed"  CBC Latest Ref Rng & Units 06/13/2017 06/12/2017 06/11/2017  WBC 3.6 - 11.0 K/uL 5.0 4.7 6.8  Hemoglobin 12.0 - 16.0 g/dL 40.912.8 81.113.2 91.415.3  Hematocrit 35.0 - 47.0 % 38.3 39.5 46.4  Platelets 150 - 440 K/uL 158 151 207

## 2017-11-19 ENCOUNTER — Ambulatory Visit (HOSPITAL_COMMUNITY)
Admission: EM | Admit: 2017-11-19 | Discharge: 2017-11-19 | Disposition: A | Discharge status: Home / Self Care | Payer: No Typology Code available for payment source | Attending: Emergency Medicine | Admitting: Emergency Medicine

## 2017-11-19 ENCOUNTER — Encounter: Payer: Self-pay | Admitting: Emergency Medicine

## 2017-11-19 ENCOUNTER — Emergency Department
Admission: EM | Admit: 2017-11-19 | Discharge: 2017-11-19 | Disposition: A | Discharge status: Home / Self Care | Payer: Medicaid Other | Attending: Emergency Medicine | Admitting: Emergency Medicine

## 2017-11-19 DIAGNOSIS — F17210 Nicotine dependence, cigarettes, uncomplicated: Secondary | ICD-10-CM | POA: Insufficient documentation

## 2017-11-19 DIAGNOSIS — Z0441 Encounter for examination and observation following alleged adult rape: Secondary | ICD-10-CM | POA: Insufficient documentation

## 2017-11-19 DIAGNOSIS — Z79899 Other long term (current) drug therapy: Secondary | ICD-10-CM | POA: Insufficient documentation

## 2017-11-19 LAB — URINE DRUG SCREEN, QUALITATIVE (ARMC ONLY)
Amphetamines, Ur Screen: NOT DETECTED
BARBITURATES, UR SCREEN: NOT DETECTED
Benzodiazepine, Ur Scrn: NOT DETECTED
CANNABINOID 50 NG, UR ~~LOC~~: NOT DETECTED
Cocaine Metabolite,Ur ~~LOC~~: NOT DETECTED
MDMA (Ecstasy)Ur Screen: NOT DETECTED
Methadone Scn, Ur: NOT DETECTED
Opiate, Ur Screen: NOT DETECTED
PHENCYCLIDINE (PCP) UR S: NOT DETECTED
Tricyclic, Ur Screen: NOT DETECTED

## 2017-11-19 LAB — POC URINE PREG, ED: PREG TEST UR: NEGATIVE

## 2017-11-19 MED ORDER — PROMETHAZINE HCL 25 MG PO TABS
25.0000 mg | ORAL_TABLET | Freq: Four times a day (QID) | ORAL | Status: DC | PRN
  Filled 2017-11-19: qty 1

## 2017-11-19 MED ORDER — ULIPRISTAL ACETATE 30 MG PO TABS
30.0000 mg | ORAL_TABLET | Freq: Once | ORAL | Status: AC
  Administered 2017-11-19: 30 mg via ORAL
  Filled 2017-11-19: qty 1

## 2017-11-19 MED ORDER — METRONIDAZOLE 500 MG PO TABS
2000.0000 mg | ORAL_TABLET | Freq: Once | ORAL | Status: AC
  Administered 2017-11-19: 2000 mg via ORAL
  Filled 2017-11-19: qty 4

## 2017-11-19 MED ORDER — LIDOCAINE HCL (PF) 1 % IJ SOLN
INTRAMUSCULAR | Status: AC
  Administered 2017-11-19: 17:00:00
  Filled 2017-11-19: qty 5

## 2017-11-19 MED ORDER — AZITHROMYCIN 500 MG PO TABS
1000.0000 mg | ORAL_TABLET | Freq: Once | ORAL | Status: AC
  Administered 2017-11-19: 1000 mg via ORAL
  Filled 2017-11-19: qty 2

## 2017-11-19 MED ORDER — CEFTRIAXONE SODIUM 250 MG IJ SOLR
250.0000 mg | Freq: Once | INTRAMUSCULAR | Status: AC
  Administered 2017-11-19: 250 mg via INTRAMUSCULAR
  Filled 2017-11-19: qty 250
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
## 2017-11-19 NOTE — Discharge Instructions (Signed)
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 after taking the last dose of this medicine. What side effects may I notice from receiving this medicine? Side effects that you should report to your doctor or health care professional as soon as possible: -allergic reactions like skin rash, itching or hives, swelling of the face, lips, or tongue -breathing problems -fever, chills -irregular heartbeat -pain when passing urine -seizures -stomach pain, cramps -unusual bleeding, bruising -unusually weak or tired Side effects that usually do not require medical attention (report to your doctor or health care professional if they continue or are  bothersome): -diarrhea -dizzy, drowsy -headache -nausea, vomiting -pain, swelling, irritation where injected -stomach upset -sweating This list may not describe all possible side effects.  Call your doctor for medical advice about side effects. You may report side effects to FDA at 1-800-FDA-1088. Where should I keep my medicine? Keep out of the reach of children. Store at room temperature below 25 degrees C (77 degrees F). Protect from light. Throw away any unused vials after the expiration date. NOTE: This sheet is a summary. It may not cover all possible information. If you have questions about this medicine, talk to your doctor, pharmacist, or health care provider.  2017 Elsevier/Gold Standard (2013-09-18 09:14:54)
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
## 2017-11-19 NOTE — ED Notes (Signed)
This RN called SANE nurse and was advised for patient to not change clothes and to keep any toilet paper if she used the restroom.  And to remain NPO.
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
## 2017-11-22 LAB — POCT PREGNANCY, URINE: Preg Test, Ur: NEGATIVE — AB

## 2018-02-12 ENCOUNTER — Emergency Department
Admission: EM | Admit: 2018-02-12 | Discharge: 2018-02-12 | Discharge status: Against Medical Advice | Payer: Medicaid Other | Attending: Emergency Medicine | Admitting: Emergency Medicine

## 2018-02-12 ENCOUNTER — Other Ambulatory Visit: Payer: Self-pay

## 2018-02-12 ENCOUNTER — Encounter: Payer: Self-pay | Admitting: Emergency Medicine

## 2018-02-12 DIAGNOSIS — Z79899 Other long term (current) drug therapy: Secondary | ICD-10-CM | POA: Insufficient documentation

## 2018-02-12 DIAGNOSIS — Z5320 Procedure and treatment not carried out because of patient's decision for unspecified reasons: Secondary | ICD-10-CM | POA: Insufficient documentation

## 2018-02-12 DIAGNOSIS — M549 Dorsalgia, unspecified: Secondary | ICD-10-CM | POA: Insufficient documentation

## 2018-02-12 DIAGNOSIS — Z0441 Encounter for examination and observation following alleged adult rape: Secondary | ICD-10-CM | POA: Insufficient documentation

## 2018-02-12 DIAGNOSIS — F17210 Nicotine dependence, cigarettes, uncomplicated: Secondary | ICD-10-CM | POA: Insufficient documentation
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
## 2018-02-12 NOTE — ED Notes (Signed)
SANE RN contacted 

## 2018-02-12 NOTE — ED Notes (Addendum)
SANE notified that pt is medically clear.
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
## 2018-02-12 NOTE — ED Notes (Signed)
SANE to bedside. 

## 2018-02-13 NOTE — SANE Note (Signed)
At 1839, [facility] was [facility] present for SANE evaluation. I requested that medical clearance be verified. I notified oncoming SANE, Beecher McardleJacque, about patient at [phone]. She stated [facility] would [facility] for more details.

## 2018-04-25 LAB — HM HIV SCREENING LAB: HM HIV Screening: NEGATIVE

## 2018-10-03 ENCOUNTER — Other Ambulatory Visit: Payer: Self-pay

## 2018-10-03 ENCOUNTER — Emergency Department
Admission: EM | Admit: 2018-10-03 | Discharge: 2018-10-04 | Disposition: A | Discharge status: Home / Self Care | Payer: Self-pay | Attending: Emergency Medicine | Admitting: Emergency Medicine

## 2018-10-03 ENCOUNTER — Encounter: Payer: Self-pay | Admitting: Emergency Medicine

## 2018-10-03 ENCOUNTER — Emergency Department: Payer: Self-pay

## 2018-10-03 DIAGNOSIS — N939 Abnormal uterine and vaginal bleeding, unspecified: Secondary | ICD-10-CM

## 2018-10-03 DIAGNOSIS — N390 Urinary tract infection, site not specified: Secondary | ICD-10-CM | POA: Insufficient documentation

## 2018-10-03 DIAGNOSIS — F17210 Nicotine dependence, cigarettes, uncomplicated: Secondary | ICD-10-CM | POA: Insufficient documentation

## 2018-10-03 DIAGNOSIS — N938 Other specified abnormal uterine and vaginal bleeding: Secondary | ICD-10-CM | POA: Insufficient documentation

## 2018-10-03 DIAGNOSIS — R102 Pelvic and perineal pain: Secondary | ICD-10-CM | POA: Insufficient documentation

## 2018-10-03 LAB — URINALYSIS, COMPLETE (UACMP) WITH MICROSCOPIC
Bacteria, UA: NONE SEEN
Glucose, UA: NEGATIVE mg/dL
Ketones, ur: 20 mg/dL — AB
Leukocytes,Ua: NEGATIVE
Nitrite: NEGATIVE
Protein, ur: 100 mg/dL — AB
RBC / HPF: >50 RBC/hpf — ABNORMAL HIGH (ref 0–5)
Specific Gravity, Urine: 1.034 — ABNORMAL HIGH (ref 1.005–1.030)
pH: 5 (ref 5.0–8.0)

## 2018-10-03 LAB — COMPREHENSIVE METABOLIC PANEL
ALT: 20 U/L (ref 0–44)
AST: 35 U/L (ref 15–41)
Albumin: 4.9 g/dL (ref 3.5–5.0)
Alkaline Phosphatase: 55 U/L (ref 38–126)
Anion gap: 12 (ref 5–15)
BUN: 17 mg/dL (ref 6–20)
CO2: 25 mmol/L (ref 22–32)
Calcium: 9.3 mg/dL (ref 8.9–10.3)
Chloride: 103 mmol/L (ref 98–111)
Creatinine, Ser: 1.21 mg/dL — ABNORMAL HIGH (ref 0.44–1.00)
GFR calc Af Amer: >60 mL/min (ref >60)
GFR calc non Af Amer: 59 mL/min — ABNORMAL LOW (ref >60)
Glucose, Bld: 83 mg/dL (ref 70–99)
Potassium: 3.5 mmol/L (ref 3.5–5.1)
Sodium: 140 mmol/L (ref 135–145)
Total Bilirubin: 0.5 mg/dL (ref 0.3–1.2)
Total Protein: 7.7 g/dL (ref 6.5–8.1)

## 2018-10-03 LAB — CBC WITH DIFFERENTIAL/PLATELET
Abs Immature Granulocytes: 0.01 10*3/uL (ref 0.00–0.07)
Basophils Absolute: 0.1 10*3/uL (ref 0.0–0.1)
Basophils Relative: 1 %
Eosinophils Absolute: 0.1 10*3/uL (ref 0.0–0.5)
Eosinophils Relative: 1 %
HCT: 46.3 % — ABNORMAL HIGH (ref 36.0–46.0)
Hemoglobin: 15.7 g/dL — ABNORMAL HIGH (ref 12.0–15.0)
Immature Granulocytes: 0 %
Lymphocytes Relative: 32 %
Lymphs Abs: 1.9 10*3/uL (ref 0.7–4.0)
MCH: 30 pg (ref 26.0–34.0)
MCHC: 33.9 g/dL (ref 30.0–36.0)
MCV: 88.4 fL (ref 80.0–100.0)
Monocytes Absolute: 0.7 10*3/uL (ref 0.1–1.0)
Monocytes Relative: 11 %
Neutro Abs: 3.3 10*3/uL (ref 1.7–7.7)
Neutrophils Relative %: 55 %
Platelets: 227 10*3/uL (ref 150–400)
RBC: 5.24 MIL/uL — ABNORMAL HIGH (ref 3.87–5.11)
RDW: 12.2 % (ref 11.5–15.5)
WBC: 6 10*3/uL (ref 4.0–10.5)
nRBC: 0 % (ref 0.0–0.2)

## 2018-10-03 LAB — POCT PREGNANCY, URINE: Preg Test, Ur: NEGATIVE

## 2018-10-03 NOTE — ED Provider Notes (Addendum)
[facility] Emergency Department Provider Note   ____________________________________________   First MD Initiated Contact with Patient 10/03/18 2312     (approximate)  I have reviewed the triage vital signs and the nursing notes.   HISTORY  Chief Complaint Vaginal Bleeding    HPI Gwendolyn Gonzales is a 33 y.o. female who reports vaginal bleeding for a year constantly.  Not very heavy apparently.  She is having some cramping now.  It is difficult to tell when it started as she is just nodding her head and making noises and not talking.         History reviewed. No pertinent past medical history.  Patient Active Problem List   Diagnosis Date Noted  . GIB (gastrointestinal bleeding) 06/11/2017  . 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 Methamphetamine use disorder, severe (HCC) 05/29/2016  . Tobacco use disorder 05/29/2016  . Cannabis use disorder, moderate, dependence (HCC) 05/29/2016    Past Surgical History:  Procedure Laterality Date  . CESAREAN SECTION    . CESAREAN SECTION  15 May 2006  . FLEXIBLE BRONCHOSCOPY W/ UPPER ENDOSCOPY    . INNER EAR SURGERY      Prior to Admission medications   Medication Sig Start Date End Date Taking? Authorizing Provider  acetaminophen (TYLENOL) 325 MG tablet Take 2 tablets (650 mg total) by mouth every 6 (six) hours as needed for mild pain (or Fever >/= 101). 06/13/17   Auburn BilberryPatel, Shreyang, MD  medroxyPROGESTERone (DEPO-PROVERA) 150 MG/ML injection Inject 150 mg into the muscle every 3 (three) months.    [provider]  OLANZapine zydis (ZYPREXA) 5 MG disintegrating tablet Take 1 tablet (5 mg total) by mouth at bedtime. Patient not taking: Reported on 06/11/2017 06/01/16   Jimmy FootmanHernandez-Gonzalez, Andrea, MD  pantoprazole (PROTONIX) 40 MG tablet Take 1 tablet (40 mg total) by mouth daily. 06/13/17 06/13/18  Auburn BilberryPatel, Shreyang, MD    Allergies Lactose  intolerance (gi), Augmentin [amoxicillin-pot clavulanate], and Biaxin [clarithromycin]  Family History  Problem Relation Age of Onset  . Throat cancer Father   . Lung cancer Father   . Breast cancer Paternal Aunt   . Ovarian cancer Paternal Aunt   . Throat cancer Paternal Aunt   . Lung cancer Paternal Aunt     Social History Social History   Tobacco Use  . Smoking status: Current Every Day Smoker    Packs/day: 0.25    Types: Cigarettes  . Smokeless tobacco: Former Engineer, waterUser  Substance Use Topics  . Alcohol use: Yes    Alcohol/week: 1.0 standard drinks    Types: 1 Cans of beer per week    Comment: occasional  . Drug use: No    Types: Methamphetamines, Marijuana    Comment: patient states, "I've been off of it a month and a half"    Review of Systems  Constitutional: No fever/chills Eyes: No visual changes. ENT: No sore throat. Cardiovascular: Denies chest pain. Respiratory: Denies shortness of breath. Gastrointestinal: No abdominal pain.  No nausea, no vomiting.  No diarrhea.  No constipation. Genitourinary: Negative for dysuria. Musculoskeletal: Negative for back pain. Skin: Negative for rash. Neurological: Negative for headaches, focal weakness   ____________________________________________   PHYSICAL EXAM:  VITAL SIGNS: ED Triage Vitals  Enc Vitals Group     BP 10/03/18 2044 100/68     Pulse Rate 10/03/18 2044 (!) 116     Resp 10/03/18 2044 18     Temp 10/03/18 2044 98.3 F (36.8 C)  Temp Source 10/03/18 2044 Oral     SpO2 10/03/18 2044 99 %     Weight 10/03/18 2045 119 lb (54 kg)     Height 10/03/18 2045 5' (1.524 m)     Head Circumference --      Peak Flow --      Pain Score 10/03/18 2044 4     Pain Loc --      Pain Edu? --      Excl. in [place]? --     Constitutional: Alert and oriented. Well appearing and in no acute distress. Eyes: Conjunctivae are normal.  Head: Atraumatic. Nose: No congestion/rhinnorhea. Mouth/Throat: Mucous membranes are  moist.   Neck: No stridor.   Cardiovascular: Normal rate, regular rhythm. Grossly normal heart sounds.  Good peripheral circulation. Respiratory: Normal respiratory effort.  No retractions. Lungs CTAB. Gastrointestinal: Soft patient complains of pain on palpation of the lower abdomen. No distention. No abdominal bruits.  Genitourinary: Patient initially agreed to pelvic exam is now refusing.  Says it hurts too much.  She does have some bleeding but it is very slight.  There is no obvious pathology in the perineum. Musculoskeletal: No lower extremity tenderness nor edema.  Neurologic:  Normal speech and language. No gross focal neurologic deficits are appreciated.  Skin:  Skin is warm, dry and intact. No rash noted.   ____________________________________________   LABS (all labs ordered are listed, but only abnormal results are displayed)  Labs Reviewed  CBC WITH DIFFERENTIAL/PLATELET - Abnormal; Notable for the following components:      Result Value   RBC 5.24 (*)    Hemoglobin 15.7 (*)    HCT 46.3 (*)    All other components within normal limits  COMPREHENSIVE METABOLIC PANEL - Abnormal; Notable for the following components:   Creatinine, Ser 1.21 (*)    GFR calc non Af Amer 59 (*)    All other components within normal limits  URINALYSIS, COMPLETE (UACMP) WITH MICROSCOPIC - Abnormal; Notable for the following components:   Color, Urine AMBER (*)    APPearance CLOUDY (*)    Specific Gravity, Urine 1.034 (*)    Hgb urine dipstick LARGE (*)    Bilirubin Urine SMALL (*)    Ketones, ur 20 (*)    Protein, ur 100 (*)    RBC / HPF >50 (*)    All other components within normal limits  CHLAMYDIA/NGC RT PCR ([facility] ONLY)  POCT PREGNANCY, URINE   ____________________________________________  EKG   ____________________________________________  RADIOLOGY  ED MD interpretation:   Official radiology report(s): US Pelvis Transvaginal Non-ob (tv Only)  Result Date:  10/04/2018 CLINICAL DATA:  33 year old female with vaginal bleeding and cramping. Negative urine pregnancy test. EXAM: TRANSABDOMINAL AND TRANSVAGINAL ULTRASOUND OF PELVIS TECHNIQUE: Both transabdominal and transvaginal ultrasound examinations of the pelvis were performed. Transabdominal technique was performed for global imaging of the pelvis including uterus, ovaries, adnexal regions, and pelvic cul-de-sac. It was necessary to proceed with endovaginal exam following the transabdominal exam to visualize the endometrium and ovaries. COMPARISON:  Pregnancy ultrasound dated 02/09/2017 FINDINGS: Uterus Measurements: 7.1 x 3.2 x 4.3 cm = volume: 51 mL. The uterus is anteverted. There is slight heterogeneity of the myometrium. No discrete mass. Endometrium Thickness: 3 mm.  No focal abnormality visualized. Right ovary Measurements: 1.9 x 1.1 x 2.2 cm = volume: 2.5 mL. Normal appearance/no adnexal mass. Left ovary Measurements: 3.0 x 1.6 x 2.9 cm = volume: 7.2 mL. The left ovary is unremarkable. There is a 1.7  cm prominent follicle/cyst. Other findings No abnormal free fluid. IMPRESSION: Unremarkable pelvic ultrasound. Electronically Signed   By: Elgie CollardArash  Radparvar M.D.   On: 10/04/2018 01:55   [place] Pelvis Complete  Result Date: 10/04/2018 CLINICAL DATA:  33 year old female with vaginal bleeding and cramping. Negative urine pregnancy test. EXAM: TRANSABDOMINAL AND TRANSVAGINAL ULTRASOUND OF PELVIS TECHNIQUE: Both transabdominal and transvaginal ultrasound examinations of the pelvis were performed. Transabdominal technique was performed for global imaging of the pelvis including uterus, ovaries, adnexal regions, and pelvic cul-de-sac. It was necessary to proceed with endovaginal exam following the transabdominal exam to visualize the endometrium and ovaries. COMPARISON:  Pregnancy ultrasound dated 02/09/2017 FINDINGS: Uterus Measurements: 7.1 x 3.2 x 4.3 cm = volume: 51 mL. The uterus is anteverted. There is slight  heterogeneity of the myometrium. No discrete mass. Endometrium Thickness: 3 mm.  No focal abnormality visualized. Right ovary Measurements: 1.9 x 1.1 x 2.2 cm = volume: 2.5 mL. Normal appearance/no adnexal mass. Left ovary Measurements: 3.0 x 1.6 x 2.9 cm = volume: 7.2 mL. The left ovary is unremarkable. There is a 1.7 cm prominent follicle/cyst. Other findings No abnormal free fluid. IMPRESSION: Unremarkable pelvic ultrasound. Electronically Signed   By: Elgie CollardArash  Radparvar M.D.   On: 10/04/2018 01:55    ____________________________________________   PROCEDURES  Procedure(s) performed (including Critical Care):  Procedures   ____________________________________________   INITIAL IMPRESSION / ASSESSMENT AND PLAN / ED COURSE   Patient's ultrasound is negative.  Patient has suprapubic pain only.  She has a UTI but also complains of pain on attempted pelvic exam.  She may have PID.  I will treat her for UTI and possible PID.  Have her follow-up with OB/GYN.  Return if she is worse.Gwendolyn PickettStephanie [name] Pantaleon was evaluated in Emergency Department on 10/04/2018 for the symptoms described in the history of present illness. She was evaluated in the context of the global COVID-19 pandemic, which necessitated consideration that the patient might be at risk for infection with the SARS-CoV-2 virus that causes COVID-19. Institutional protocols and algorithms that pertain to the evaluation of patients at risk for COVID-19 are in a state of rapid change based on information released by regulatory bodies including the CDC and federal and state organizations. These policies and algorithms were followed during the patient's care in the ED.    Patient's allergy to Augmentin is cramps and diarrhea.  This is not really an allergy.  More of a side effect.         ____________________________________________   FINAL CLINICAL IMPRESSION(S) / ED DIAGNOSES  Final diagnoses:  Vaginal bleeding  Pelvic pain   Urinary tract infection without hematuria, site unspecified     ED Discharge Orders    None       Note:  This document was prepared using Dragon voice recognition software and may include unintentional dictation errors.    Arnaldo NatalMalinda, Paul F, MD 10/04/18 [phone]    Arnaldo NatalMalinda, Paul F, MD 10/04/18 [phone]

## 2018-10-03 NOTE — ED Triage Notes (Addendum)
Pt to triage via w/c brought in by EMS for c/o vag bleeding; mask in place with no distress noted; pt is disheveled and barefoot; pt has pants but no underwear (scant amount bleeding noted on pants); pt given pad/panties/pants and socks to put on--st she is homeless and has a court date in 3 days; pt st vag bleeding x year, increased today with abd cramping; pt wants to know if we will keep her

## 2018-10-04 LAB — CHLAMYDIA/NGC RT PCR (ARMC ONLY)
Chlamydia Tr: NOT DETECTED
N gonorrhoeae: NOT DETECTED

## 2018-10-04 MED ORDER — CEPHALEXIN 500 MG PO CAPS: 500.0000 mg | ORAL_CAPSULE | Freq: Three times a day (TID) | ORAL | 0 refills | Status: AC

## 2018-10-04 MED ORDER — CEFTRIAXONE SODIUM 1 G IJ SOLR
500.0000 mg | Freq: Once | INTRAMUSCULAR | Status: AC
  Administered 2018-10-04: 500 mg via INTRAMUSCULAR
  Filled 2018-10-04: qty 10

## 2018-10-04 MED ORDER — DOXYCYCLINE HYCLATE 100 MG PO TBEC: 100.0000 mg | DELAYED_RELEASE_TABLET | Freq: Two times a day (BID) | ORAL | 0 refills | Status: DC

## 2018-10-04 MED ORDER — DOXYCYCLINE HYCLATE 100 MG PO TABS
100.0000 mg | ORAL_TABLET | Freq: Once | ORAL | Status: AC
  Administered 2018-10-04: 03:00:00 100 mg via ORAL
  Filled 2018-10-04: qty 1

## 2018-10-04 NOTE — ED Notes (Signed)
Attempted to perform pelvic exam with Dr. Cinda Quest. Patient was unwilling to follow instructions (had to be reminded several times to remove pants prior to exam). Patient was unable to tolerate speculum insertion.

## 2018-10-04 NOTE — Discharge Instructions (Addendum)
You have a urinary tract infection and may also have pelvic inflammatory disease based on the amount of pain you are having.  Please take the Keflex 1 pill 3 times a day and the doxycycline 1 twice a day till are gone.  Please give Dr. Star Age a call in let him know you were in the emergency room with pelvic pain.  He should be able to see you for follow-up in the next week or so.  Please return for worse pain fever vomiting or feeling sicker.

## 2018-10-04 NOTE — ED Notes (Signed)
Patient transported to Ultrasound with ED tech for chaperone.

## 2018-10-04 NOTE — ED Notes (Signed)
Pt was given a warm blanket and gingerale.

## 2018-11-27 ENCOUNTER — Emergency Department
Admission: EM | Admit: 2018-11-27 | Discharge: 2018-11-28 | Disposition: A | Discharge status: Home / Self Care | Payer: Self-pay | Attending: Emergency Medicine | Admitting: Emergency Medicine

## 2018-11-27 ENCOUNTER — Other Ambulatory Visit: Payer: Self-pay

## 2018-11-27 DIAGNOSIS — F1994 Other psychoactive substance use, unspecified with psychoactive substance-induced mood disorder: Secondary | ICD-10-CM

## 2018-11-27 DIAGNOSIS — Y906 Blood alcohol level of 120-199 mg/100 ml: Secondary | ICD-10-CM | POA: Insufficient documentation

## 2018-11-27 DIAGNOSIS — F1490 Cocaine use, unspecified, uncomplicated: Secondary | ICD-10-CM

## 2018-11-27 DIAGNOSIS — F17210 Nicotine dependence, cigarettes, uncomplicated: Secondary | ICD-10-CM | POA: Insufficient documentation

## 2018-11-27 DIAGNOSIS — F10929 Alcohol use, unspecified with intoxication, unspecified: Secondary | ICD-10-CM | POA: Insufficient documentation

## 2018-11-27 DIAGNOSIS — F29 Unspecified psychosis not due to a substance or known physiological condition: Secondary | ICD-10-CM | POA: Insufficient documentation

## 2018-11-27 DIAGNOSIS — Z7289 Other problems related to lifestyle: Secondary | ICD-10-CM

## 2018-11-27 DIAGNOSIS — Z789 Other specified health status: Secondary | ICD-10-CM

## 2018-11-27 DIAGNOSIS — F1410 Cocaine abuse, uncomplicated: Secondary | ICD-10-CM | POA: Insufficient documentation

## 2018-11-27 LAB — COMPREHENSIVE METABOLIC PANEL
ALT: 16 U/L (ref 0–44)
AST: 18 U/L (ref 15–41)
Albumin: 4.3 g/dL (ref 3.5–5.0)
Alkaline Phosphatase: 75 U/L (ref 38–126)
Anion gap: 13 (ref 5–15)
BUN: 9 mg/dL (ref 6–20)
CO2: 19 mmol/L — ABNORMAL LOW (ref 22–32)
Calcium: 9.4 mg/dL (ref 8.9–10.3)
Chloride: 106 mmol/L (ref 98–111)
Creatinine, Ser: 0.75 mg/dL (ref 0.44–1.00)
GFR calc Af Amer: >60 mL/min (ref >60)
GFR calc non Af Amer: >60 mL/min (ref >60)
Glucose, Bld: 101 mg/dL — ABNORMAL HIGH (ref 70–99)
Potassium: 3.4 mmol/L — ABNORMAL LOW (ref 3.5–5.1)
Sodium: 138 mmol/L (ref 135–145)
Total Bilirubin: 0.3 mg/dL (ref 0.3–1.2)
Total Protein: 7.1 g/dL (ref 6.5–8.1)

## 2018-11-27 LAB — CBC
HCT: 46.3 % — ABNORMAL HIGH (ref 36.0–46.0)
Hemoglobin: 15.4 g/dL — ABNORMAL HIGH (ref 12.0–15.0)
MCH: 29.8 pg (ref 26.0–34.0)
MCHC: 33.3 g/dL (ref 30.0–36.0)
MCV: 89.6 fL (ref 80.0–100.0)
Platelets: 293 10*3/uL (ref 150–400)
RBC: 5.17 MIL/uL — ABNORMAL HIGH (ref 3.87–5.11)
RDW: 13.1 % (ref 11.5–15.5)
WBC: 7.8 10*3/uL (ref 4.0–10.5)
nRBC: 0 % (ref 0.0–0.2)

## 2018-11-27 LAB — URINE DRUG SCREEN, QUALITATIVE (ARMC ONLY)
Amphetamines, Ur Screen: NOT DETECTED
Barbiturates, Ur Screen: NOT DETECTED
Benzodiazepine, Ur Scrn: NOT DETECTED
Cannabinoid 50 Ng, Ur ~~LOC~~: NOT DETECTED
Cocaine Metabolite,Ur ~~LOC~~: POSITIVE — AB
MDMA (Ecstasy)Ur Screen: NOT DETECTED
Methadone Scn, Ur: NOT DETECTED
Opiate, Ur Screen: NOT DETECTED
Phencyclidine (PCP) Ur S: NOT DETECTED
Tricyclic, Ur Screen: NOT DETECTED

## 2018-11-27 LAB — ETHANOL: Alcohol, Ethyl (B): 156 mg/dL — ABNORMAL HIGH (ref <10)

## 2018-11-27 LAB — POCT PREGNANCY, URINE: Preg Test, Ur: NEGATIVE

## 2018-11-27 LAB — SALICYLATE LEVEL: Salicylate Lvl: <7 mg/dL (ref 2.8–30.0)

## 2018-11-27 LAB — ACETAMINOPHEN LEVEL: Acetaminophen (Tylenol), Serum: <10 ug/mL — ABNORMAL LOW (ref 10–30)

## 2018-11-27 NOTE — ED Notes (Signed)
SOC comm ck att
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
## 2018-11-27 NOTE — ED Notes (Signed)
Report to [facility] Dr Philis Pique att

## 2018-11-27 NOTE — ED Provider Notes (Signed)
[facility] Emergency Department Provider Note   ____________________________________________    I have reviewed the triage vital signs and the nursing notes.   HISTORY  Chief Complaint Psychiatric evaluation    HPI Gwendolyn PickettStephanie [name] Gonzales is a 33 y.o. female who presents for psychiatric evaluation.  Patient reports her friends brought her to "get checked out ".  She is repeatedly telling me that she is "being seen through a deity" and that her eyes are "alien eyes".  She denies substance abuse however does have a history of polysubstance abuse.  Review of medical records does not demonstrate a history of mental illness.  No past medical history on file.  Patient Active Problem List   Diagnosis Date Noted  . GIB (gastrointestinal bleeding) 06/11/2017  . 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 Methamphetamine use disorder, severe (HCC) 05/29/2016  . Tobacco use disorder 05/29/2016  . Cannabis use disorder, moderate, dependence (HCC) 05/29/2016    Past Surgical History:  Procedure Laterality Date  . CESAREAN SECTION    . CESAREAN SECTION  15 May 2006  . FLEXIBLE BRONCHOSCOPY W/ UPPER ENDOSCOPY    . INNER EAR SURGERY      Prior to Admission medications   Medication Sig Start Date End Date Taking? Authorizing Provider  acetaminophen (TYLENOL) 325 MG tablet Take 2 tablets (650 mg total) by mouth every 6 (six) hours as needed for mild pain (or Fever >/= 101). Patient not taking: Reported on 11/27/2018 06/13/17   Auburn BilberryPatel, Shreyang, MD  doxycycline (DORYX) 100 MG EC tablet Take 1 tablet (100 mg total) by mouth 2 (two) times daily. Patient not taking: Reported on 11/27/2018 10/04/18   Arnaldo NatalMalinda, Paul F, MD  medroxyPROGESTERone (DEPO-PROVERA) 150 MG/ML injection Inject 150 mg into the muscle every 3 (three) months.    [provider]  OLANZapine zydis (ZYPREXA) 5 MG disintegrating tablet Take 1 tablet  (5 mg total) by mouth at bedtime. Patient not taking: Reported on 06/11/2017 06/01/16   Jimmy FootmanHernandez-Gonzalez, Andrea, MD  pantoprazole (PROTONIX) 40 MG tablet Take 1 tablet (40 mg total) by mouth daily. 06/13/17 06/13/18  Auburn BilberryPatel, Shreyang, MD     Allergies Lactose intolerance (gi), Augmentin [amoxicillin-pot clavulanate], and Biaxin [clarithromycin]  Family History  Problem Relation Age of Onset  . Throat cancer Father   . Lung cancer Father   . Breast cancer Paternal Aunt   . Ovarian cancer Paternal Aunt   . Throat cancer Paternal Aunt   . Lung cancer Paternal Aunt     Social History Social History   Tobacco Use  . Smoking status: Current Every Day Smoker    Packs/day: 0.25    Types: Cigarettes  . Smokeless tobacco: Former Engineer, waterUser  Substance Use Topics  . Alcohol use: Yes    Alcohol/week: 1.0 standard drinks    Types: 1 Cans of beer per week    Comment: occasional  . Drug use: No    Types: Methamphetamines, Marijuana    Comment: patient states, "I've been off of it a month and a half"    Review of Systems  Constitutional: No fever/chills Eyes: No visual changes.  ENT: No neck pain Cardiovascular: Denies chest pain. Respiratory: Denies shortness of breath. Gastrointestinal: No abdominal pain.   Genitourinary: Negative for dysuria. Musculoskeletal: Negative for back pain. Skin: Negative for rash. Neurological: Negative for headaches   ____________________________________________   PHYSICAL EXAM:  VITAL SIGNS: ED Triage Vitals [11/27/18 2125]  Enc Vitals Group  BP (!) 138/100     Pulse Rate (!) 140     Resp (!) 22     Temp 99 F (37.2 C)     Temp src      SpO2 98 %     Weight      Height      Head Circumference      Peak Flow      Pain Score 0     Pain Loc      Pain Edu?      Excl. in [place]?     Constitutional: Alert and oriented. Eyes: Conjunctivae are normal.   Nose: No congestion/rhinnorhea. Mouth/Throat: Mucous membranes are moist.     Cardiovascular: Mild tachycardia, has improved from triage, heart rate 104, regular rhythm.  Good peripheral circulation. Respiratory: Normal respiratory effort.  No retractions.  Gastrointestinal: Soft and nontender. No distention.    Musculoskeletal:   Warm and well perfused Neurologic:  Normal speech and language. No gross focal neurologic deficits are appreciated.  Skin:  Skin is warm, dry and intact. No rash noted. Psychiatric: Patient is agitated and possibly psychotic with her discussion of the deities and aliens  ____________________________________________   LABS (all labs ordered are listed, but only abnormal results are displayed)  Labs Reviewed  COMPREHENSIVE METABOLIC PANEL - Abnormal; Notable for the following components:      Result Value   Potassium 3.4 (*)    CO2 19 (*)    Glucose, Bld 101 (*)    All other components within normal limits  ETHANOL - Abnormal; Notable for the following components:   Alcohol, Ethyl (B) 156 (*)    All other components within normal limits  ACETAMINOPHEN LEVEL - Abnormal; Notable for the following components:   Acetaminophen (Tylenol), Serum <10 (*)    All other components within normal limits  CBC - Abnormal; Notable for the following components:   RBC 5.17 (*)    Hemoglobin 15.4 (*)    HCT 46.3 (*)    All other components within normal limits  URINE DRUG SCREEN, QUALITATIVE ([facility] ONLY) - Abnormal; Notable for the following components:   Cocaine Metabolite,Ur [place] POSITIVE (*)    All other components within normal limits  SALICYLATE LEVEL  POC URINE PREG, ED  POCT PREGNANCY, URINE   ____________________________________________  EKG  None ____________________________________________  RADIOLOGY  None ____________________________________________   PROCEDURES  Procedure(s) performed: No  Procedures   Critical Care performed: No ____________________________________________   INITIAL IMPRESSION / ASSESSMENT AND  PLAN / ED COURSE  Pertinent labs & imaging results that were available during my care of the patient were reviewed by me and considered in my medical decision making (see chart for details).  Patient presents for psychiatric evaluation, she is exhibiting some psychoses, possibility of drug-induced.  She is cocaine positive and does have alcohol on her labs.  I will have psychiatry see her, I do not feel that she is an acute danger to herself or others.    ____________________________________________   FINAL CLINICAL IMPRESSION(S) / ED DIAGNOSES  Final diagnoses:  Psychosis, unspecified psychosis type ([place])        Note:  This document was prepared using Dragon voice recognition software and may include unintentional dictation errors.   Lavonia Drafts, MD 11/27/18 2256

## 2018-11-27 NOTE — ED Notes (Signed)
Pt's belongings given to [facility] as pt requested.
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
## 2018-11-28 NOTE — ED Notes (Signed)
[phone], Gwendolyn Gonzales, called: has pt belongings and clothes, unsure if she can pick up pt - will call back

## 2018-11-28 NOTE — ED Notes (Signed)
Pt requesting water, this EDT will provide water to pt

## 2018-11-28 NOTE — Discharge Instructions (Signed)
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
## 2018-11-28 NOTE — ED Notes (Signed)
Pt dressed into blue scrubs   No peripheral IV placed this visit.  Discharge instructions reviewed with patient. Questions fielded by this RN. Patient verbalizes understanding of instructions. Patient discharged home in stable condition per forbach. No acute distress noted at time of discharge.   Pt walked to lobby
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
## 2019-04-17 DIAGNOSIS — B977 Papillomavirus as the cause of diseases classified elsewhere: Secondary | ICD-10-CM

## 2019-04-18 ENCOUNTER — Ambulatory Visit: Payer: Self-pay

## 2019-05-11 IMAGING — US US OB TRANSVAGINAL
1 series · 13 of 28 positions shown · non-contrast
Comparison: None.

CLINICAL DATA: Initial evaluation for acute right lower quadrant
pain for 4 days. Early pregnancy.

EXAM:
OBSTETRIC <14 WK US AND TRANSVAGINAL OB US
TECHNIQUE: Both transabdominal and transvaginal ultrasound examinations were
performed for complete evaluation of the gestation as well as the
maternal uterus, adnexal regions, and pelvic cul-de-sac.
Transvaginal technique was performed to assess early pregnancy.

[Series 1: us ob transvaginal · 0.23mm/px · 13 of 137 slices shown]
[im 6/137]
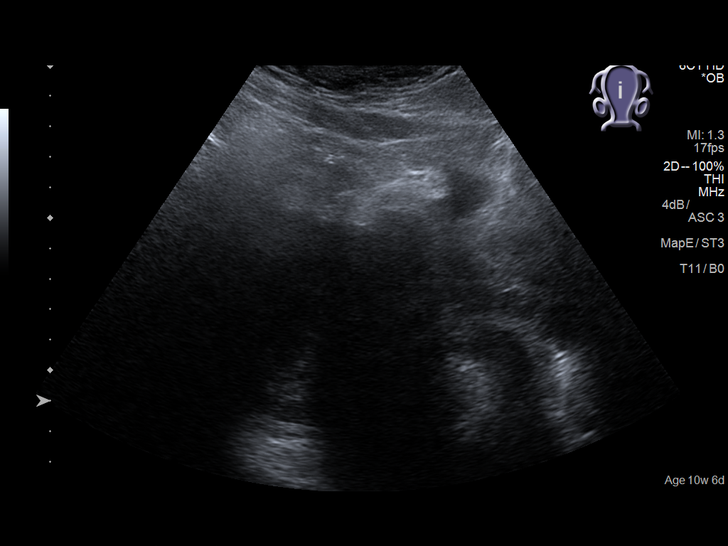
[im 16/137]
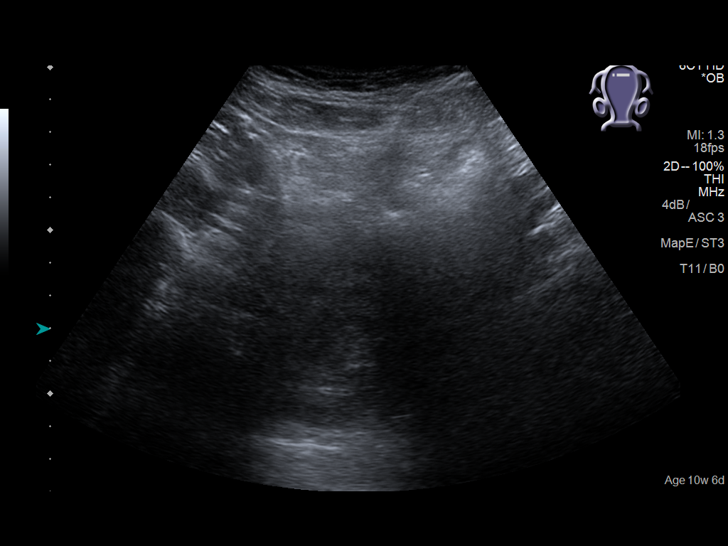
[im 26/137]
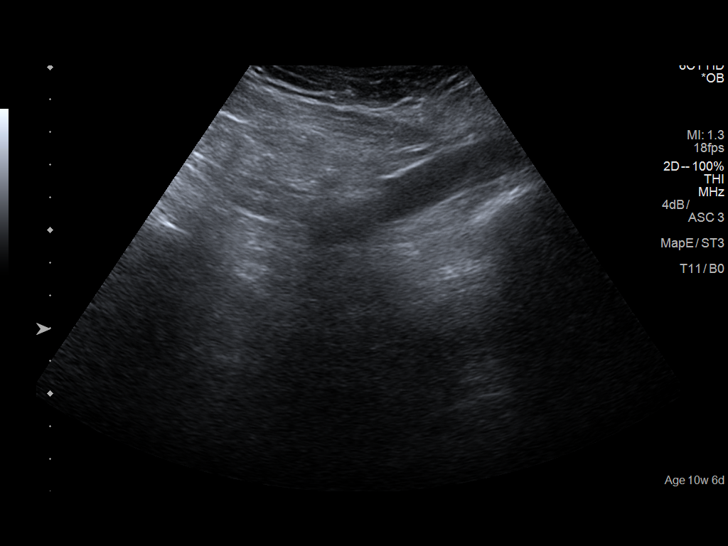
[im 36/137]
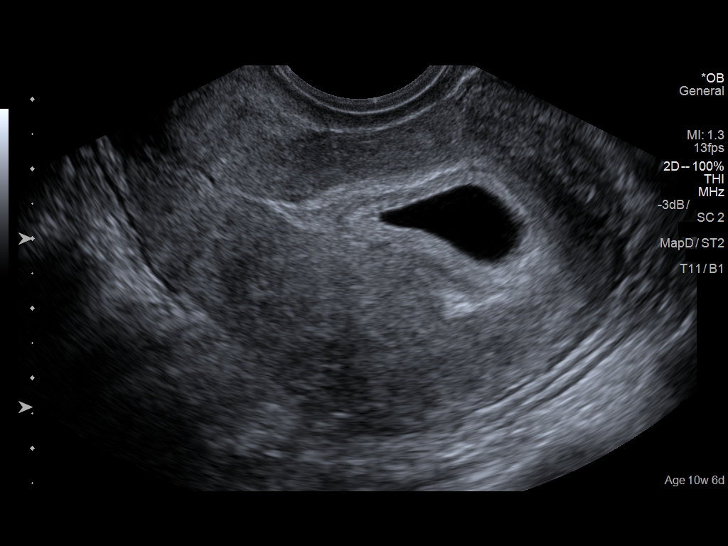
[im 46/137]
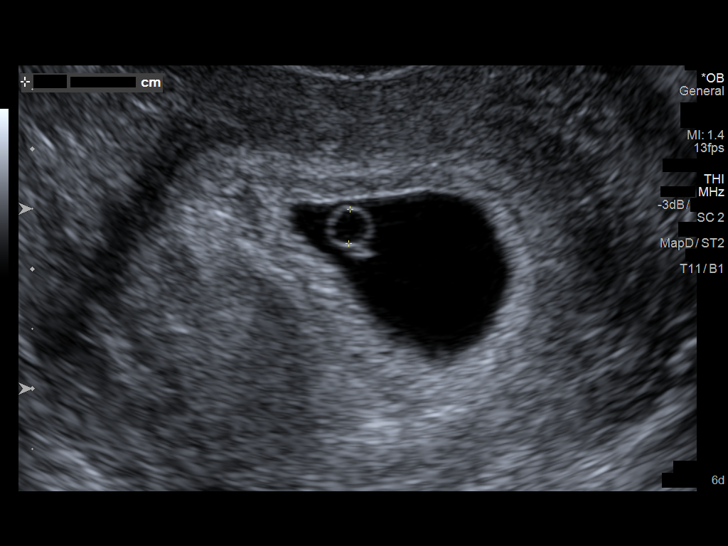
[im 56/137]
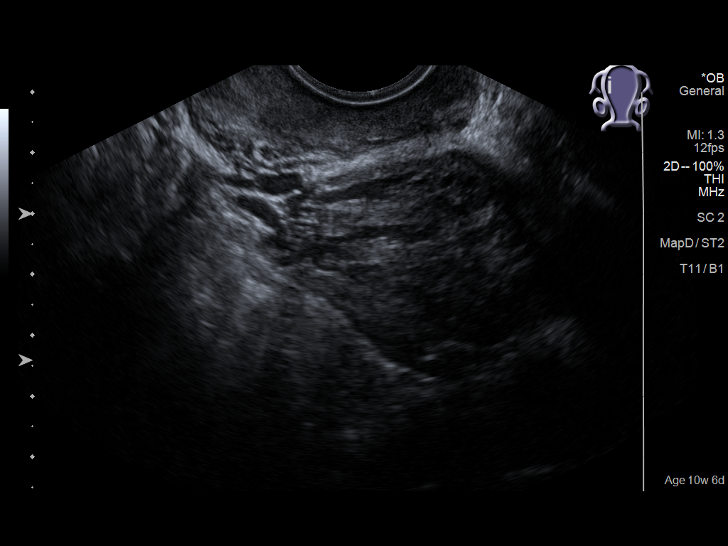
[im 71/137]
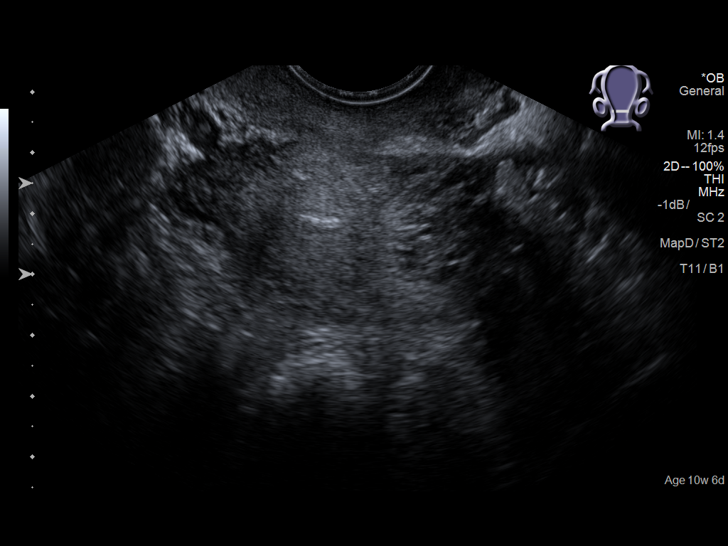
[im 81/137]
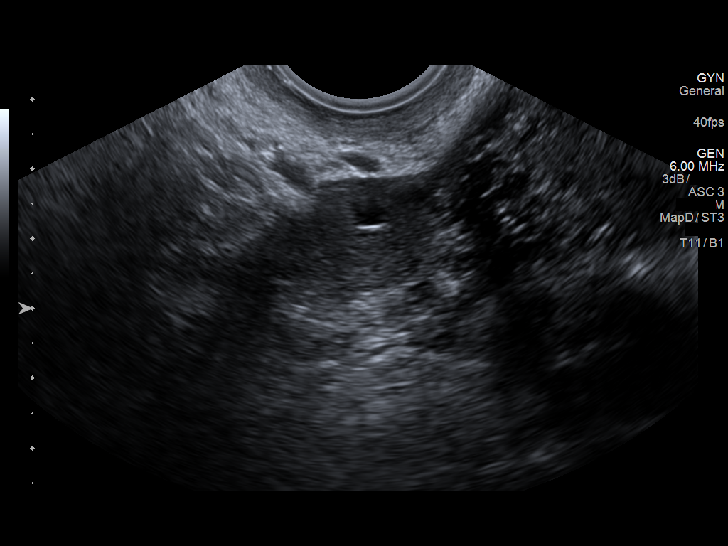
[im 91/137]
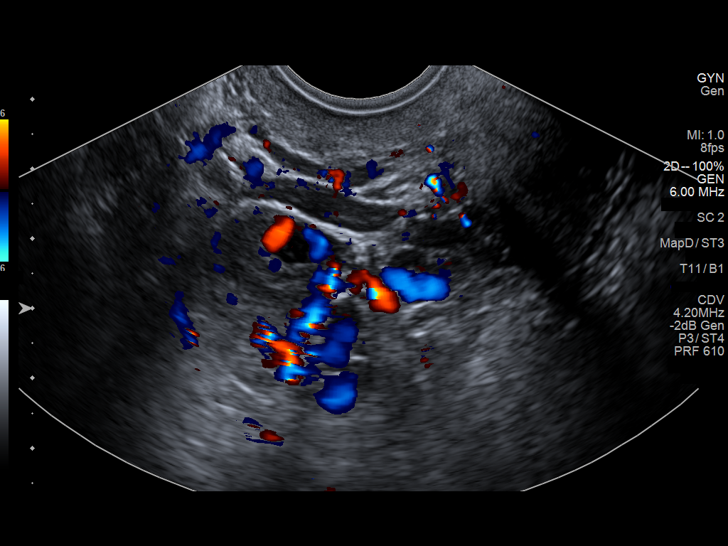
[im 101/137]
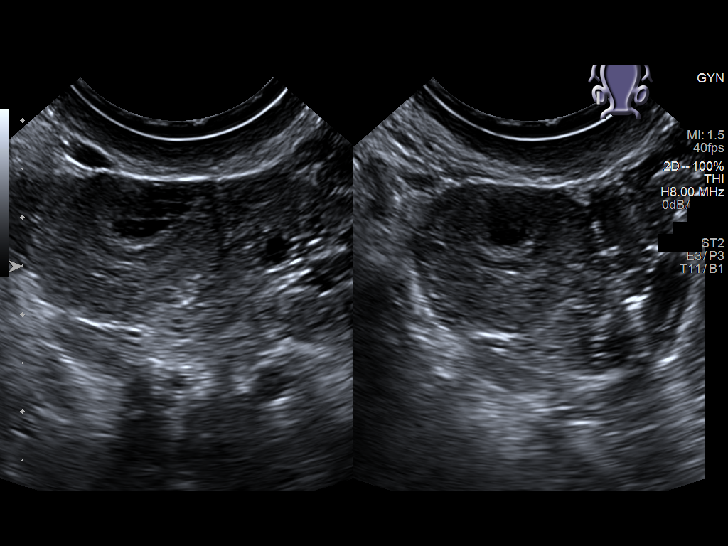
[im 111/137]
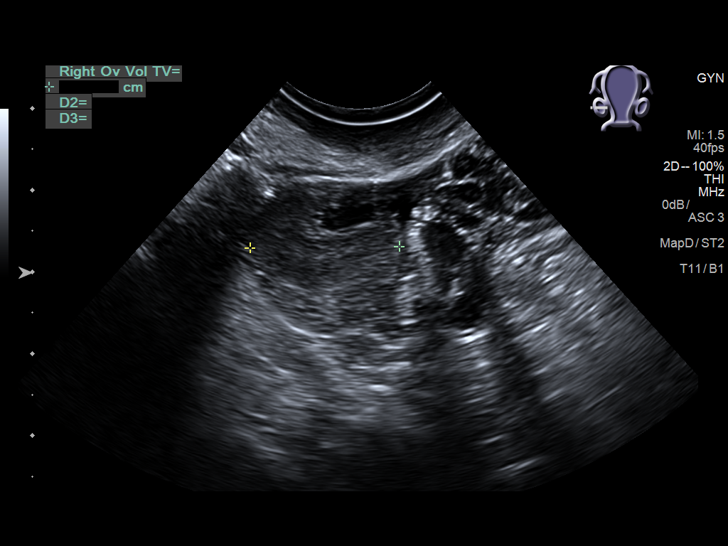
[im 121/137]
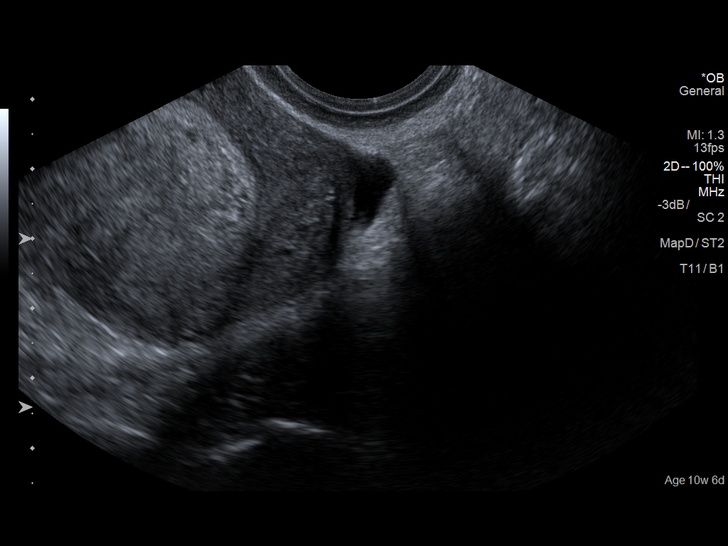
[im 131/137]
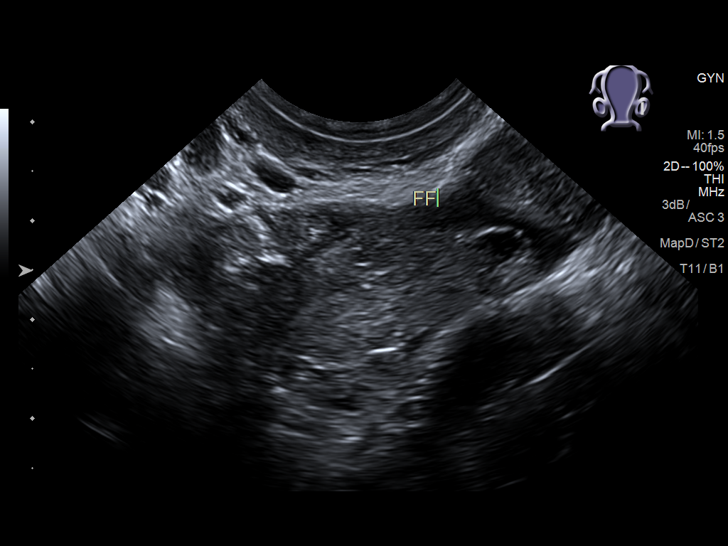

[13 of 28 positions shown; findings below may reference images not displayed]

FINDINGS: Intrauterine gestational sac: Single

Yolk sac:  Present

Embryo:  Present

Cardiac Activity: Present

Heart Rate: 106  bpm

CRL:  5.3  mm   6 w   2 d                  US EDC: 10/03/2017

Subchorionic hemorrhage:  None visualized.

Maternal uterus/adnexae: Left ovary is normal in appearance.
Probable small corpus luteal cyst noted within the right ovary.
Small volume free fluid noted within pelvis, most notable within the
left adnexa and adjacent to the uterus.
IMPRESSION: 1. Single viable intrauterine pregnancy as above, estimated
gestational age 6 weeks and 2 days by crown-rump length. No
complication.
2. Small probable right ovarian corpus luteal cyst with small volume
free physiologic fluid within the pelvis.
3. No other acute maternal uterine or adnexal abnormality.

## 2019-07-12 ENCOUNTER — Telehealth: Payer: Self-pay | Admitting: Pharmacy Technician

## 2019-07-12 NOTE — Telephone Encounter (Signed)
Received updated proof of income.  Patient eligible to receive medication assistance at [facility] until time for re-certification in 9359, and as long as eligibility requirements continue to be met.  [facility]

## 2019-08-01 ENCOUNTER — Emergency Department: Payer: Self-pay

## 2019-08-01 ENCOUNTER — Emergency Department
Admission: EM | Admit: 2019-08-01 | Discharge: 2019-08-01 | Disposition: A | Discharge status: Home / Self Care | Payer: Self-pay | Attending: Emergency Medicine | Admitting: Emergency Medicine

## 2019-08-01 DIAGNOSIS — N938 Other specified abnormal uterine and vaginal bleeding: Secondary | ICD-10-CM | POA: Insufficient documentation

## 2019-08-01 DIAGNOSIS — R569 Unspecified convulsions: Secondary | ICD-10-CM | POA: Insufficient documentation

## 2019-08-01 LAB — CBC WITH DIFFERENTIAL/PLATELET
Abs Immature Granulocytes: 0.05 10*3/uL (ref 0.00–0.07)
Basophils Absolute: 0.1 10*3/uL (ref 0.0–0.1)
Basophils Relative: 1 %
Eosinophils Absolute: 0.1 10*3/uL (ref 0.0–0.5)
Eosinophils Relative: 1 %
HCT: 43.9 % (ref 36.0–46.0)
Hemoglobin: 14.9 g/dL (ref 12.0–15.0)
Immature Granulocytes: 1 %
Lymphocytes Relative: 16 %
Lymphs Abs: 1.1 10*3/uL (ref 0.7–4.0)
MCH: 31.2 pg (ref 26.0–34.0)
MCHC: 33.9 g/dL (ref 30.0–36.0)
MCV: 91.8 fL (ref 80.0–100.0)
Monocytes Absolute: 0.3 10*3/uL (ref 0.1–1.0)
Monocytes Relative: 5 %
Neutro Abs: 5.1 10*3/uL (ref 1.7–7.7)
Neutrophils Relative %: 76 %
Platelets: 266 10*3/uL (ref 150–400)
RBC: 4.78 MIL/uL (ref 3.87–5.11)
RDW: 13 % (ref 11.5–15.5)
WBC: 6.7 10*3/uL (ref 4.0–10.5)
nRBC: 0 % (ref 0.0–0.2)

## 2019-08-01 LAB — COMPREHENSIVE METABOLIC PANEL
ALT: 22 U/L (ref 0–44)
AST: 27 U/L (ref 15–41)
Albumin: 4 g/dL (ref 3.5–5.0)
Alkaline Phosphatase: 51 U/L (ref 38–126)
Anion gap: 5 (ref 5–15)
BUN: 11 mg/dL (ref 6–20)
CO2: 27 mmol/L (ref 22–32)
Calcium: 8.5 mg/dL — ABNORMAL LOW (ref 8.9–10.3)
Chloride: 108 mmol/L (ref 98–111)
Creatinine, Ser: 0.68 mg/dL (ref 0.44–1.00)
GFR calc Af Amer: >60 mL/min (ref >60)
GFR calc non Af Amer: >60 mL/min (ref >60)
Glucose, Bld: 97 mg/dL (ref 70–99)
Potassium: 4.5 mmol/L (ref 3.5–5.1)
Sodium: 140 mmol/L (ref 135–145)
Total Bilirubin: 0.6 mg/dL (ref 0.3–1.2)
Total Protein: 6.8 g/dL (ref 6.5–8.1)

## 2019-08-01 LAB — URINALYSIS, COMPLETE (UACMP) WITH MICROSCOPIC
Bacteria, UA: NONE SEEN
RBC / HPF: >50 RBC/hpf — ABNORMAL HIGH (ref 0–5)
Specific Gravity, Urine: 1.01 (ref 1.005–1.030)
WBC, UA: >50 WBC/hpf — ABNORMAL HIGH (ref 0–5)

## 2019-08-01 LAB — TYPE AND SCREEN
ABO/RH(D): B POS
Antibody Screen: NEGATIVE

## 2019-08-01 LAB — URINE DRUG SCREEN, QUALITATIVE (ARMC ONLY)
Amphetamines, Ur Screen: NOT DETECTED
Barbiturates, Ur Screen: NOT DETECTED
Benzodiazepine, Ur Scrn: NOT DETECTED
Cannabinoid 50 Ng, Ur ~~LOC~~: POSITIVE — AB
Cocaine Metabolite,Ur ~~LOC~~: NOT DETECTED
MDMA (Ecstasy)Ur Screen: NOT DETECTED
Methadone Scn, Ur: NOT DETECTED
Opiate, Ur Screen: NOT DETECTED
Phencyclidine (PCP) Ur S: NOT DETECTED
Tricyclic, Ur Screen: NOT DETECTED

## 2019-08-01 LAB — POCT PREGNANCY, URINE: Preg Test, Ur: NEGATIVE

## 2019-08-01 LAB — ETHANOL: Alcohol, Ethyl (B): <10 mg/dL (ref <10)

## 2019-08-01 MED ORDER — KETOROLAC TROMETHAMINE 30 MG/ML IJ SOLN
30.0000 mg | Freq: Once | INTRAMUSCULAR | Status: AC
  Administered 2019-08-01: 30 mg via INTRAVENOUS
  Filled 2019-08-01: qty 1

## 2019-08-01 MED ORDER — SODIUM CHLORIDE 0.9 % IV BOLUS
1000.0000 mL | Freq: Once | INTRAVENOUS | Status: AC
  Administered 2019-08-01: 1000 mL via INTRAVENOUS

## 2019-08-01 MED ORDER — LEVETIRACETAM 500 MG PO TABS
500.0000 mg | ORAL_TABLET | Freq: Once | ORAL | Status: AC
  Administered 2019-08-01: 500 mg via ORAL
  Filled 2019-08-01: qty 1

## 2019-08-01 MED ORDER — LEVETIRACETAM 500 MG PO TABS
500.0000 mg | ORAL_TABLET | Freq: Two times a day (BID) | ORAL | 2 refills | Status: DC
Start: 2019-08-01 — End: 2020-06-16

## 2019-08-01 NOTE — ED Notes (Signed)
Pt verbalized understanding of discharge instructions and need for follow up care. Pt is in NAD at this time.
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
## 2019-08-01 NOTE — ED Provider Notes (Signed)
[facility] Provider Note       Time seen: ----------------------------------------- 10:39 AM on 08/01/2019 -----------------------------------------   I have reviewed the vital signs and the nursing notes.  HISTORY   Chief Complaint No chief complaint on file.  HPI Gwendolyn Gonzales is a 34 y.o. female with no significant past medical history who presents today for a seizure.  Patient was with her boyfriend when reportedly she had a seizure-like episode.  She did bite her tongue but was not incontinent.  She is been having heavy vaginal bleeding for the last 3 days.  Bleeding is occurring before her typical period.  Is not sure if she is pregnant.  Was reportedly having tonic-clonic like activity.  No past medical history on file.  Past Surgical History:  Procedure Laterality Date  . CESAREAN SECTION    . CESAREAN SECTION  15 May 2006  . FLEXIBLE BRONCHOSCOPY W/ UPPER ENDOSCOPY    . INNER EAR SURGERY      Allergies Lactose intolerance (gi), Augmentin [amoxicillin-pot clavulanate], Biaxin [clarithromycin], and Latex   Review of Systems Constitutional: Negative for fever. Cardiovascular: Negative for chest pain. Respiratory: Negative for shortness of breath. Gastrointestinal: Negative for abdominal pain, vomiting and diarrhea. Musculoskeletal: Positive for back pain Skin: Negative for rash. Neurological: Negative for headaches, focal weakness or numbness.  Positive for seizure  All systems negative/normal/unremarkable except as stated in the HPI  ____________________________________________   PHYSICAL EXAM:  VITAL SIGNS: ED Triage Vitals  Enc Vitals Group     BP      Pulse      Resp      Temp      Temp src      SpO2      Weight      Height      Head Circumference      Peak Flow      Pain Score      Pain Loc      Pain Edu?      Excl. in GC?     Constitutional: Alert and oriented. Well appearing and in no  distress. Eyes: Conjunctivae are normal. Normal extraocular movements. ENT      Head: Normocephalic and atraumatic.      Nose: No congestion/rhinnorhea.      Mouth/Throat: Mucous membranes are moist.  Small tongue laceration is noted      Neck: No stridor. Cardiovascular: Normal rate, regular rhythm. No murmurs, rubs, or gallops. Respiratory: Normal respiratory effort without tachypnea nor retractions. Breath sounds are clear and equal bilaterally. No wheezes/rales/rhonchi. Gastrointestinal: Soft and nontender. Normal bowel sounds Musculoskeletal: Nontender with normal range of motion in extremities. No lower extremity tenderness nor edema. Neurologic:  Normal speech and language. No gross focal neurologic deficits are appreciated.  Skin:  Skin is warm, dry and intact. No rash noted. Psychiatric: Speech and behavior are normal.  ____________________________________________   LABS (pertinent positives/negatives)  Labs Reviewed  COMPREHENSIVE METABOLIC PANEL - Abnormal; Notable for the following components:      Result Value   Calcium 8.5 (*)    All other components within normal limits  URINALYSIS, COMPLETE (UACMP) WITH MICROSCOPIC - Abnormal; Notable for the following components:   Color, Urine RED (*)    APPearance HAZY (*)    Glucose, UA   (*)    Value: TEST NOT REPORTED DUE TO COLOR INTERFERENCE OF URINE PIGMENT   Hgb urine dipstick   (*)    Value: TEST NOT REPORTED DUE TO COLOR  INTERFERENCE OF URINE PIGMENT   Bilirubin Urine   (*)    Value: TEST NOT REPORTED DUE TO COLOR INTERFERENCE OF URINE PIGMENT   Ketones, ur   (*)    Value: TEST NOT REPORTED DUE TO COLOR INTERFERENCE OF URINE PIGMENT   Protein, ur   (*)    Value: TEST NOT REPORTED DUE TO COLOR INTERFERENCE OF URINE PIGMENT   Nitrite   (*)    Value: TEST NOT REPORTED DUE TO COLOR INTERFERENCE OF URINE PIGMENT   Leukocytes,Ua   (*)    Value: TEST NOT REPORTED DUE TO COLOR INTERFERENCE OF URINE PIGMENT   RBC / HPF >50  (*)    WBC, UA >50 (*)    All other components within normal limits  URINE DRUG SCREEN, QUALITATIVE ([facility] ONLY) - Abnormal; Notable for the following components:   Cannabinoid 50 Ng, Ur [place] POSITIVE (*)    All other components within normal limits  CBC WITH DIFFERENTIAL/PLATELET  ETHANOL  POCT PREGNANCY, URINE  POC URINE PREG, ED  TYPE AND SCREEN    RADIOLOGY  Images were viewed by me CT head IMPRESSION: No acute intracranial pathology. No non-contrast CT findings to explain seizure.   DIFFERENTIAL DIAGNOSIS  Pregnancy, anemia, seizure, withdrawal, dehydration, electrolyte abnormality  ASSESSMENT AND PLAN  Seizure   Plan: The patient had presented for a seizure. Patient's labs did not reveal any acute process, she is currently menstruating. Patient's imaging reveals no acute intracranial pathology.  She has had 1 prior seizure, no specific explanation for this seizure which does sound like a tonic-clonic type event.  She will be placed on Keppra, advised not to drive and referred to neurology for outpatient follow-up.  Lenise Arena MD    Note: This note was generated in part or whole with voice recognition software. Voice recognition is usually quite accurate but there are transcription errors that can and very often do occur. I apologize for any typographical errors that were not detected and corrected.     Gwendolyn Newport, MD 08/01/19 1235

## 2019-08-02 LAB — URINE CULTURE

## 2019-09-12 ENCOUNTER — Other Ambulatory Visit: Payer: Self-pay

## 2019-09-15 ENCOUNTER — Other Ambulatory Visit: Payer: Self-pay

## 2019-09-19 ENCOUNTER — Other Ambulatory Visit: Payer: Self-pay

## 2019-09-19 ENCOUNTER — Ambulatory Visit (LOCAL_COMMUNITY_HEALTH_CENTER): Payer: Medicaid Other

## 2019-09-19 VITALS — BP 100/68 | Ht 60.0 in | Wt 142.0 lb

## 2019-09-19 DIAGNOSIS — Z3201 Encounter for pregnancy test, result positive: Secondary | ICD-10-CM

## 2019-09-19 LAB — PREGNANCY, URINE: Preg Test, Ur: POSITIVE — AB

## 2019-09-19 MED ORDER — PRENATAL VITAMIN 27-0.8 MG PO TABS: 1.0000 | ORAL_TABLET | Freq: Every day | ORAL | 0 refills | Status: DC

## 2019-09-19 NOTE — Progress Notes (Signed)
Counseled to avoid BC poswders, aspirin and aspirin containing products. Currently taking Abilify and counseled to call [facility] today to ascertain if needs to continue / discontinue due to pregnancy. Currently taking Keppra due to seizure 07/2019. Per client, Keppra prescribed by [facility] ED. Consult with Hazle Coca CNM concerning Keppra. Per Ms. Sciora: 1) Keppra is a Category C medication, 2) do not stop taking as seizure may occur, 3) call [facility] / [facility] regarding continued use of Keppra in pregnancy ASAP and for ASAP appt (may try contacting prescribing provider at [facility] ED as well for directions regarding continued use) and 4) client is high risk and may not receive PNC at [facility]. Jossie Ng, RN

## 2019-12-28 DIAGNOSIS — F445 Conversion disorder with seizures or convulsions: Secondary | ICD-10-CM | POA: Insufficient documentation

## 2020-01-05 DIAGNOSIS — G40909 Epilepsy, unspecified, not intractable, without status epilepticus: Secondary | ICD-10-CM | POA: Insufficient documentation

## 2020-01-05 DIAGNOSIS — Z98891 History of uterine scar from previous surgery: Secondary | ICD-10-CM | POA: Insufficient documentation

## 2020-01-24 ENCOUNTER — Other Ambulatory Visit: Payer: Self-pay

## 2020-01-24 ENCOUNTER — Ambulatory Visit: Payer: Medicaid Other

## 2020-01-24 VITALS — BP 118/90

## 2020-01-24 DIAGNOSIS — Z79899 Other long term (current) drug therapy: Secondary | ICD-10-CM

## 2020-01-24 NOTE — Progress Notes (Signed)
[facility] Visit Note  Patient: Gwendolyn Gonzales MRN: [id number] Date of Birth: 1986-02-27 PCP: Patient, No Pcp Per   Gwendolyn Gonzales 34 y.o. female presents for a medication therapy management via telephone visit today.  BP 118/90   LMP 08/01/2019 (Exact Date)   Patient Information   Past Medical History:  Diagnosis Date  . Anxiety   . Depression   . History of abnormal cervical Pap smear    "years ago"  . History of asthma    as child  . History of chlamydia   . History of urinary tract infection   . Seizure (HCC)    07/2019      Past Surgical History:  Procedure Laterality Date  . CESAREAN SECTION    . CESAREAN SECTION  15 May 2006  . FLEXIBLE BRONCHOSCOPY W/ UPPER ENDOSCOPY    . INNER EAR SURGERY       Family History  Problem Relation Age of Onset  . Throat cancer Father   . Lung cancer Father   . Breast cancer Paternal Aunt   . Ovarian cancer Paternal Aunt   . Throat cancer Paternal Aunt   . Lung cancer Paternal Aunt   . Hypertension Mother   . Anxiety disorder Mother   . High Cholesterol Mother   . Hypertension Maternal Grandmother   . High Cholesterol Maternal Grandmother   . Leukemia Maternal Grandmother   . Diabetes Maternal Grandmother   . Hypertension Maternal Grandfather   . High Cholesterol Maternal Grandfather     New Diagnoses (since last visit): None  Lifestyle Diet: Breakfast: poptart Snack: poptart Lunch: hamburger Dinner: nachos Drinks: V8 juice and water    Current Exercise Habits: The patient does not participate in regular exercise at present  Exercise limited by: None identified    Social History   Substance and Sexual Activity  Alcohol Use Yes   Comment: Last ETOH use "couple of years ago"      Social History   Tobacco Use  Smoking Status Current Every Day Smoker  . Packs/day: 0.25  . Years: 21.00  . Pack years: 5.25  . Types: Cigarettes  Smokeless Tobacco Former Programmer, applications  Topic Date Due  . Hepatitis C Screening  Never done  . COVID-19 Vaccine (1) Never done  . TETANUS/TDAP  Never done  . INFLUENZA VACCINE  Never done  . PAP SMEAR-Modifier  05/06/2020  . HIV Screening  Completed   Health Maintenance/Date Completed  Last ED visit: 08/01/19 Last Visit to PCP: 01/05/20 Next Visit to PCP: 02/05/20 Specialist Visit: attempting to schedule with [facility] neurologist currently Dental Exam: attempting to schedule  Eye Exam: 02/19/2020 Flu Vaccine: pt refused COVID-19 Vaccine: pt refused  Outpatient Encounter Medications as of 01/24/2020  Medication Sig  . acetaminophen (TYLENOL) 325 MG tablet Take 2 tablets (650 mg total) by mouth every 6 (six) hours as needed for mild pain (or Fever >/= 101).  . ARIPiprazole (ABILIFY) 30 MG tablet Take 30 mg by mouth daily.  [name] levETIRAcetam (KEPPRA) 500 MG tablet Take 1 tablet (500 mg total) by mouth 2 (two) times daily. (Patient taking differently: Take 500 mg by mouth in the morning, at noon, and at bedtime. )  . pantoprazole (PROTONIX) 40 MG tablet Take 1 tablet (40 mg total) by mouth daily.  . Prenatal Vit-Fe Fumarate-FA (PRENATAL VITAMIN) 27-0.8 MG TABS Take 1 tablet by mouth daily at 6 (six) AM.  . medroxyPROGESTERone (DEPO-PROVERA) 150  MG/ML injection Inject 150 mg into the muscle every 3 (three) months. (Patient not taking: Reported on 09/19/2019)  . OLANZapine zydis (ZYPREXA) 5 MG disintegrating tablet Take 1 tablet (5 mg total) by mouth at bedtime. (Patient not taking: Reported on 06/11/2017)  . [DISCONTINUED] doxycycline (DORYX) 100 MG EC tablet Take 1 tablet (100 mg total) by mouth 2 (two) times daily. (Patient not taking: Reported on 11/27/2018)   No facility-administered encounter medications on file as of 01/24/2020.    Assessment and Plan:  Access/adherence: Pt reports no issues with access to transportation, housing, or food. Pt reports she does not have issues remembering to take  medications.   Seizures: Pt takes levetiracetam 500 mg 3x/day. Pt most recently experienced seizure in May which is when she was started on levetiracetam. Plan: Continue levetiracetam 3x/day.   Pregnancy: Pt currently takes a prenatal vitamin daily. Plan: Continue with scheduled prenatal care and prenatal vitamin daily.  Smoking: Pt states she is attempting to quit smoking.  Plan: Informed pt to let us know if we can provide any resources to aid in smoking cessation.  Psychosis: Pt was taking abilify prior to pregnancy however pt is currently not taking. Pt asked about safety of abilify in pregnancy - I informed her that the risk is based on the individual and to consult her doctor.  Plan: Pt will consult OB/Gyn to determine if she should continue Abilify.   Blood pressure: Patient states she monitors blood pressure at home with readings 110-120/70-90.   In addition to the above, pt takes tylenol as needed for pain and pantoprazole 40 mg daily.   Patient to follow up for Outreach MTM in 1 year.   Reatha Armour, PharmD Pharmacy Resident  01/24/2020 4:16 PM

## 2020-02-05 DIAGNOSIS — Z87898 Personal history of other specified conditions: Secondary | ICD-10-CM | POA: Insufficient documentation

## 2020-05-06 ENCOUNTER — Telehealth: Payer: Self-pay | Admitting: Pharmacy Technician

## 2020-05-06 NOTE — Telephone Encounter (Signed)
Pt has Medicaid with prescription coverage.  No longer meets [facility]'s eligibility criteria.  Sherilyn Dacosta Care Manager [facility]   [address] [place], [place]  [address]  May 06, 2020    [facility] [address] [place], [place]  [address]  Dear Judeth Cornfield:  This is to inform you that you are no longer eligible to receive medication assistance at [facility].  The reason(s) are:    _____Your total gross monthly household income exceeds 250% of the Federal Poverty Level.   _____Tangible assets (savings, checking, stocks/bonds, pension, retirement, etc.) exceeds our limit  __X__You are eligible to receive benefits from [facility], [facility] or HIV Medication            Assistance program _____You are eligible to receive benefits from a Medicare Part "D" plan __X__You have prescription insurance  _____You are not an [facility] resident _____Failure to provide all requested proof of income information for 2022.    We regret that we are unable to help you at this time.  If your prescription coverage is terminated, please contact [facility], so that we may reassess your eligibility for our program.  If you have questions, we may be contacted at [phone].  Thank you,  [facility]

## 2020-06-14 ENCOUNTER — Inpatient Hospital Stay
Admission: EM | Admit: 2020-06-14 | Discharge: 2020-06-16 | DRG: 918 | Disposition: A | Discharge status: Home / Self Care | Payer: Medicaid Other | Attending: Internal Medicine | Admitting: Internal Medicine

## 2020-06-14 ENCOUNTER — Other Ambulatory Visit: Payer: Self-pay

## 2020-06-14 ENCOUNTER — Emergency Department: Payer: Medicaid Other

## 2020-06-14 DIAGNOSIS — Z79899 Other long term (current) drug therapy: Secondary | ICD-10-CM

## 2020-06-14 DIAGNOSIS — G40909 Epilepsy, unspecified, not intractable, without status epilepticus: Secondary | ICD-10-CM | POA: Diagnosis present

## 2020-06-14 DIAGNOSIS — Z20822 Contact with and (suspected) exposure to covid-19: Secondary | ICD-10-CM | POA: Diagnosis present

## 2020-06-14 DIAGNOSIS — Z793 Long term (current) use of hormonal contraceptives: Secondary | ICD-10-CM

## 2020-06-14 DIAGNOSIS — E874 Mixed disorder of acid-base balance: Secondary | ICD-10-CM | POA: Diagnosis present

## 2020-06-14 DIAGNOSIS — T39091A Poisoning by salicylates, accidental (unintentional), initial encounter: Principal | ICD-10-CM | POA: Diagnosis present

## 2020-06-14 DIAGNOSIS — T50901A Poisoning by unspecified drugs, medicaments and biological substances, accidental (unintentional), initial encounter: Secondary | ICD-10-CM | POA: Diagnosis present

## 2020-06-14 DIAGNOSIS — F32A Depression, unspecified: Secondary | ICD-10-CM | POA: Diagnosis present

## 2020-06-14 DIAGNOSIS — R079 Chest pain, unspecified: Secondary | ICD-10-CM

## 2020-06-14 DIAGNOSIS — Z91040 Latex allergy status: Secondary | ICD-10-CM

## 2020-06-14 DIAGNOSIS — F17210 Nicotine dependence, cigarettes, uncomplicated: Secondary | ICD-10-CM | POA: Diagnosis present

## 2020-06-14 DIAGNOSIS — Z881 Allergy status to other antibiotic agents status: Secondary | ICD-10-CM

## 2020-06-14 DIAGNOSIS — J45901 Unspecified asthma with (acute) exacerbation: Secondary | ICD-10-CM | POA: Diagnosis present

## 2020-06-14 DIAGNOSIS — K219 Gastro-esophageal reflux disease without esophagitis: Secondary | ICD-10-CM | POA: Diagnosis present

## 2020-06-14 DIAGNOSIS — F419 Anxiety disorder, unspecified: Secondary | ICD-10-CM | POA: Diagnosis present

## 2020-06-14 LAB — BASIC METABOLIC PANEL
Anion gap: 12 (ref 5–15)
BUN: 18 mg/dL (ref 6–20)
CO2: 11 mmol/L — ABNORMAL LOW (ref 22–32)
Calcium: 8.5 mg/dL — ABNORMAL LOW (ref 8.9–10.3)
Chloride: 116 mmol/L — ABNORMAL HIGH (ref 98–111)
Creatinine, Ser: 0.89 mg/dL (ref 0.44–1.00)
GFR, Estimated: >60 mL/min (ref >60)
Glucose, Bld: 112 mg/dL — ABNORMAL HIGH (ref 70–99)
Potassium: 3.4 mmol/L — ABNORMAL LOW (ref 3.5–5.1)
Sodium: 139 mmol/L (ref 135–145)

## 2020-06-14 LAB — POC URINE PREG, ED: Preg Test, Ur: NEGATIVE

## 2020-06-14 LAB — CBC
HCT: 42.3 % (ref 36.0–46.0)
Hemoglobin: 14.4 g/dL (ref 12.0–15.0)
MCH: 29.8 pg (ref 26.0–34.0)
MCHC: 34 g/dL (ref 30.0–36.0)
MCV: 87.4 fL (ref 80.0–100.0)
Platelets: 371 10*3/uL (ref 150–400)
RBC: 4.84 MIL/uL (ref 3.87–5.11)
RDW: 15.7 % — ABNORMAL HIGH (ref 11.5–15.5)
WBC: 5.5 10*3/uL (ref 4.0–10.5)
nRBC: 0 % (ref 0.0–0.2)

## 2020-06-14 LAB — HEPATIC FUNCTION PANEL
ALT: 81 U/L — ABNORMAL HIGH (ref 0–44)
AST: 56 U/L — ABNORMAL HIGH (ref 15–41)
Albumin: 3.8 g/dL (ref 3.5–5.0)
Alkaline Phosphatase: 50 U/L (ref 38–126)
Bilirubin, Direct: <0.1 mg/dL (ref 0.0–0.2)
Total Bilirubin: 0.7 mg/dL (ref 0.3–1.2)
Total Protein: 6.6 g/dL (ref 6.5–8.1)

## 2020-06-14 LAB — D-DIMER, QUANTITATIVE: D-Dimer, Quant: 0.37 ug/mL-FEU (ref 0.00–0.50)

## 2020-06-14 LAB — ETHANOL: Alcohol, Ethyl (B): <10 mg/dL (ref <10)

## 2020-06-14 LAB — SALICYLATE LEVEL: Salicylate Lvl: 60.5 mg/dL (ref 7.0–30.0)

## 2020-06-14 LAB — BRAIN NATRIURETIC PEPTIDE: B Natriuretic Peptide: 21.6 pg/mL (ref 0.0–100.0)

## 2020-06-14 LAB — LIPASE, BLOOD: Lipase: 47 U/L (ref 11–51)

## 2020-06-14 LAB — CK: Total CK: 226 U/L (ref 38–234)

## 2020-06-14 LAB — TROPONIN I (HIGH SENSITIVITY)
Troponin I (High Sensitivity): 3 ng/L (ref <18)
Troponin I (High Sensitivity): 2 ng/L (ref <18)

## 2020-06-14 MED ORDER — SODIUM CHLORIDE 0.9 % IV BOLUS
1000.0000 mL | Freq: Once | INTRAVENOUS | Status: AC
  Administered 2020-06-14: 1000 mL via INTRAVENOUS

## 2020-06-14 MED ORDER — SODIUM CHLORIDE 0.9 % IV BOLUS
500.0000 mL | Freq: Once | INTRAVENOUS | Status: AC
  Administered 2020-06-14: 500 mL via INTRAVENOUS

## 2020-06-14 MED ORDER — SODIUM BICARBONATE 8.4 % IV SOLN
INTRAVENOUS | Status: DC
  Filled 2020-06-14: qty 150
  Filled 2020-06-14 (×6): qty 850
  Filled 2020-06-14: qty 150
  Filled 2020-06-14: qty 850

## 2020-06-14 MED ORDER — IPRATROPIUM-ALBUTEROL 0.5-2.5 (3) MG/3ML IN SOLN
3.0000 mL | Freq: Once | RESPIRATORY_TRACT | Status: AC
  Administered 2020-06-14: 3 mL via RESPIRATORY_TRACT
  Filled 2020-06-14: qty 3

## 2020-06-14 MED ORDER — SODIUM BICARBONATE 8.4 % IV SOLN
50.0000 meq | Freq: Once | INTRAVENOUS | Status: AC
  Administered 2020-06-14: 50 meq via INTRAVENOUS
  Filled 2020-06-14: qty 50

## 2020-06-14 NOTE — ED Triage Notes (Addendum)
PT to ED via POV c/o cp that is like a squeezing feeling all over her chest . PT states this has been going on for a week. PT also states cold sweats/night sweats, fevers, and SHOB. She also states that she has a swollen area on her head even though she has not been in any fights recently. Also states she is coughing up white rubbery stuff with a little bit of blood.

## 2020-06-14 NOTE — ED Notes (Signed)
Patient reports shortness of breath and chest pain x 1 week. Patient reports hx of asthma, and reports she is out of her refills for her inhalers/nebulizers. Patient oxygen saturation noted to be 98% on RA.

## 2020-06-14 NOTE — ED Notes (Signed)
Delay in drug screen result discussed with Dr. Fuller Plan after speaking to lab about the delay. No new orders.

## 2020-06-14 NOTE — ED Provider Notes (Signed)
[facility] Emergency Department Provider Note  ____________________________________________   Event Date/Time   First MD Initiated Contact with Patient 06/14/20 2048     (approximate)  I have reviewed the triage vital signs and the nursing notes.   HISTORY  Chief Complaint Chest Pain    HPI Gwendolyn Gonzales is a 35 y.o. female with anxiety, depression who comes in for chest pain.  Patient reports having some chest pain and shortness of breath for the past week, intermittent, nothing makes it better including her inhalers, nothing makes it worse.  She states that she has had some coughing associated with it with a little bit of blood-tinged to it and that she noted that when she was coughing she felt like something popped on her left side.  She is a little bit of pain on her left upper abdomen.  She denies any alcohol or drug use.  Does report using cigarettes and vaping.  Patient did have a recent pregnancy back in February.          Past Medical History:  Diagnosis Date  . Anxiety   . Depression   . History of abnormal cervical Pap smear    "years ago"  . History of asthma    as child  . History of chlamydia   . History of urinary tract infection   . Seizure (HCC)    07/2019    Patient Active Problem List   Diagnosis Date Noted  . GIB (gastrointestinal bleeding) 06/11/2017  . 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 Methamphetamine use disorder, severe (HCC) 05/29/2016  . Tobacco use disorder 05/29/2016  . Cannabis use disorder, moderate, dependence (HCC) 05/29/2016  . Asthma 03/31/2007    Past Surgical History:  Procedure Laterality Date  . CESAREAN SECTION    . CESAREAN SECTION  15 May 2006  . FLEXIBLE BRONCHOSCOPY W/ UPPER ENDOSCOPY    . INNER EAR SURGERY      Prior to Admission medications   Medication Sig Start Date End Date Taking?  Authorizing Provider  acetaminophen (TYLENOL) 325 MG tablet Take 2 tablets (650 mg total) by mouth every 6 (six) hours as needed for mild pain (or Fever >/= 101). 06/13/17   Auburn Bilberry, MD  ARIPiprazole (ABILIFY) 30 MG tablet Take 30 mg by mouth daily.    [provider]  levETIRAcetam (KEPPRA) 500 MG tablet Take 1 tablet (500 mg total) by mouth 2 (two) times daily. Patient taking differently: Take 500 mg by mouth in the morning, at noon, and at bedtime.  08/01/19   Emily Filbert, MD  medroxyPROGESTERone (DEPO-PROVERA) 150 MG/ML injection Inject 150 mg into the muscle every 3 (three) months. Patient not taking: Reported on 09/19/2019    [provider]  OLANZapine zydis (ZYPREXA) 5 MG disintegrating tablet Take 1 tablet (5 mg total) by mouth at bedtime. Patient not taking: Reported on 06/11/2017 06/01/16   Jimmy Footman, MD  pantoprazole (PROTONIX) 40 MG tablet Take 1 tablet (40 mg total) by mouth daily. 06/13/17 01/24/20  Auburn Bilberry, MD  Prenatal Vit-Fe Fumarate-FA (PRENATAL VITAMIN) 27-0.8 MG TABS Take 1 tablet by mouth daily at 6 (six) AM. 09/19/19   Federico Flake, MD    Allergies Augmentin [amoxicillin-pot clavulanate], Lactose intolerance (gi), Biaxin [clarithromycin], and Latex  Family History  Problem Relation Age of Onset  . Throat cancer Father   . Lung cancer Father   . Breast cancer Paternal  Aunt   . Ovarian cancer Paternal Aunt   . Throat cancer Paternal Aunt   . Lung cancer Paternal Aunt   . Hypertension Mother   . Anxiety disorder Mother   . High Cholesterol Mother   . Hypertension Maternal Grandmother   . High Cholesterol Maternal Grandmother   . Leukemia Maternal Grandmother   . Diabetes Maternal Grandmother   . Hypertension Maternal Grandfather   . High Cholesterol Maternal Grandfather     Social History Social History   Tobacco Use  . Smoking status: Current Every Day Smoker    Packs/day: 0.25    Years: 21.00     Pack years: 5.25    Types: Cigarettes  . Smokeless tobacco: Former Clinical biochemist  . Vaping Use: Former  Substance Use Topics  . Alcohol use: Not Currently    Comment: Last ETOH use "couple of years ago"  . Drug use: Not Currently    Types: Methamphetamines, Marijuana    Comment: States clean x 1 - 2 years      Review of Systems Constitutional: Positive chills Eyes: No visual changes. ENT: No sore throat. Cardiovascular: Positive chest pain Respiratory: Positive shortness of breath Gastrointestinal: No abdominal pain.  No nausea, no vomiting.  No diarrhea.  No constipation. Genitourinary: Negative for dysuria. Musculoskeletal: Negative for back pain. Skin: Negative for rash. Neurological: Negative for headaches, focal weakness or numbness. All other ROS negative ____________________________________________   PHYSICAL EXAM:  VITAL SIGNS: ED Triage Vitals  Enc Vitals Group     BP 06/14/20 1704 104/73     Pulse Rate 06/14/20 1704 81     Resp 06/14/20 1704 (!) 22     Temp 06/14/20 1704 98.9 F (37.2 C)     Temp Source 06/14/20 1704 Oral     SpO2 06/14/20 1704 98 %     Weight 06/14/20 1705 115 lb (52.2 kg)     Height 06/14/20 1705 5\' 2"  (1.575 m)     Head Circumference --      Peak Flow --      Pain Score 06/14/20 1705 6     Pain Loc --      Pain Edu? --      Excl. in GC? --     Constitutional: Alert and oriented. Well appearing and in no acute distress. Eyes: Conjunctivae are normal. EOMI. Head: Atraumatic. Nose: No congestion/rhinnorhea. Mouth/Throat: Mucous membranes are moist.   Neck: No stridor. Trachea Midline. FROM Cardiovascular: Normal rate, regular rhythm. Grossly normal heart sounds.  Good peripheral circulation. Respiratory: Normal respiratory effort.  No retractions. Lungs CTAB. Gastrointestinal: Soft and nontender with maybe a little bit of tenderness in the left upper quadrant no distention. No abdominal bruits.  Musculoskeletal: No lower  extremity tenderness nor edema.  No joint effusions. Neurologic:  Normal speech and language. No gross focal neurologic deficits are appreciated.  Skin:  Skin is warm, dry and intact. No rash noted. Psychiatric: Mood and affect are normal.  Pressured speech, GU: Deferred   ____________________________________________   LABS (all labs ordered are listed, but only abnormal results are displayed)  Labs Reviewed  BASIC METABOLIC PANEL - Abnormal; Notable for the following components:      Result Value   Potassium 3.4 (*)    Chloride 116 (*)    CO2 11 (*)    Glucose, Bld 112 (*)    Calcium 8.5 (*)    All other components within normal limits  CBC - Abnormal; Notable for the  following components:   RDW 15.7 (*)    All other components within normal limits  HEPATIC FUNCTION PANEL - Abnormal; Notable for the following components:   AST 56 (*)    ALT 81 (*)    All other components within normal limits  BLOOD GAS, VENOUS - Abnormal; Notable for the following components:   pH, Ven 7.44 (*)    pCO2, Ven 19 (*)    pO2, Ven 60.0 (*)    Bicarbonate 12.9 (*)    Acid-base deficit 9.4 (*)    All other components within normal limits  SALICYLATE LEVEL - Abnormal; Notable for the following components:   Salicylate Lvl 60.5 (*)    All other components within normal limits  LIPASE, BLOOD  D-DIMER, QUANTITATIVE  BRAIN NATRIURETIC PEPTIDE  ETHANOL  URINE DRUG SCREEN, QUALITATIVE ([facility] ONLY)  CK  ACETAMINOPHEN LEVEL  POC URINE PREG, ED  TROPONIN I (HIGH SENSITIVITY)  TROPONIN I (HIGH SENSITIVITY)   ____________________________________________   ED ECG REPORT I, Concha Se, the attending physician, personally viewed and interpreted this ECG.  Sinus rate of 100, no ST elevation, T wave version in V3, normal intervals ____________________________________________  RADIOLOGY Gwendolyn Gonzales, personally viewed and evaluated these images (plain radiographs) as part of my medical decision  making, as well as reviewing the written report by the radiologist.  ED MD interpretation: No pneumonia  Official radiology report(s): DG Chest 2 View  Result Date: 06/14/2020 CLINICAL DATA:  Cough, chest pain, night sweats, fevers and shortness of breath EXAM: CHEST - 2 VIEW COMPARISON:  Radiograph 06/11/2017 FINDINGS: No consolidation, features of edema, pneumothorax, or effusion. Pulmonary vascularity is normally distributed. The cardiomediastinal contours are unremarkable. No acute osseous or soft tissue abnormality. Bilateral metallic nipple ornamentation. IMPRESSION: No acute cardiopulmonary abnormality. Electronically Signed   By: Kreg Shropshire M.D.   On: 06/14/2020 17:39    ____________________________________________   PROCEDURES  Procedure(s) performed (including Critical Care):  .1-3 Lead EKG Interpretation Performed by: Concha Se, MD Authorized by: Concha Se, MD     Interpretation: normal     ECG rate:  80s    ECG rate assessment: normal     Rhythm: sinus rhythm     Ectopy: none     Conduction: abnormal       ____________________________________________   INITIAL IMPRESSION / ASSESSMENT AND PLAN / ED COURSE   Berdena Cisek Costin was evaluated in Emergency Department on 06/14/2020 for the symptoms described in the history of present illness. She was evaluated in the context of the global COVID-19 pandemic, which necessitated consideration that the patient might be at risk for infection with the SARS-CoV-2 virus that causes COVID-19. Institutional protocols and algorithms that pertain to the evaluation of patients at risk for COVID-19 are in a state of rapid change based on information released by regulatory bodies including the CDC and federal and state organizations. These policies and algorithms were followed during the patient's care in the ED.    Most Likely DDx:  -Patient comes in with some chest pain and shortness of breath.  I do not hear any  wheezing on exam but patient like to trial some albuterol given her history of asthma.  Patient does have a little bit of on affect my question of there is any substance abuse but she denied.  Will get urine drug screen.   DDx that was also considered d/t potential to cause harm, but was found less likely based on history and  physical (as detailed above): -PNA (no fevers, cough but CXR to evaluate) -PNX (reassured with equal b/l breath sounds, CXR to evaluate) -Symptomatic anemia (will get H&H) -Pulmonary embolism lower risk but will get D-dimer -Aortic Dissection as no tearing pain and no radiation to the mid back, pulses equal -Pericarditis no rub on exam, EKG changes or hx to suggest dx -Tamponade (no notable SOB, tachycardic, hypotensive) -Esophageal rupture (no h/o diffuse vomitting/no crepitus)  Labs are reassuring with negative cardio markers, D-dimer is negative however bicarb is very low and her VBG is consistent with a respiratory alkalosis.  We will add on salicylate level, Tylenol level and CK level given slightly elevated LFTs.  Patient does not appear to be having significant respiratory rate but there is concern that there might have been positive drugs in her urine drug screen and so they are doing a recheck of this.    Patient handed off to oncoming team pending labs  ____________________________________________   FINAL CLINICAL IMPRESSION(S) / ED DIAGNOSES   Final diagnoses:  Chest pain, unspecified type     MEDICATIONS GIVEN DURING THIS VISIT:  Medications  sodium chloride 0.9 % bolus 1,000 mL (has no administration in time range)  ipratropium-albuterol (DUONEB) 0.5-2.5 (3) MG/3ML nebulizer solution 3 mL (3 mLs Nebulization Given 06/14/20 2227)  sodium chloride 0.9 % bolus 500 mL (500 mLs Intravenous New Bag/Given 06/14/20 2227)     ED Discharge Orders    None       Note:  This document was prepared using Dragon voice recognition software and may include  unintentional dictation errors.   Concha SeFunke, Jonnette Nuon E, MD 06/14/20 2328

## 2020-06-15 DIAGNOSIS — K219 Gastro-esophageal reflux disease without esophagitis: Secondary | ICD-10-CM | POA: Diagnosis present

## 2020-06-15 DIAGNOSIS — Z91040 Latex allergy status: Secondary | ICD-10-CM | POA: Diagnosis not present

## 2020-06-15 DIAGNOSIS — Z20822 Contact with and (suspected) exposure to covid-19: Secondary | ICD-10-CM | POA: Diagnosis present

## 2020-06-15 DIAGNOSIS — G40909 Epilepsy, unspecified, not intractable, without status epilepticus: Secondary | ICD-10-CM | POA: Diagnosis present

## 2020-06-15 DIAGNOSIS — F32A Depression, unspecified: Secondary | ICD-10-CM | POA: Diagnosis present

## 2020-06-15 DIAGNOSIS — T39091A Poisoning by salicylates, accidental (unintentional), initial encounter: Secondary | ICD-10-CM | POA: Diagnosis present

## 2020-06-15 DIAGNOSIS — R079 Chest pain, unspecified: Secondary | ICD-10-CM | POA: Diagnosis not present

## 2020-06-15 DIAGNOSIS — Z79899 Other long term (current) drug therapy: Secondary | ICD-10-CM | POA: Diagnosis not present

## 2020-06-15 DIAGNOSIS — F17210 Nicotine dependence, cigarettes, uncomplicated: Secondary | ICD-10-CM | POA: Diagnosis present

## 2020-06-15 DIAGNOSIS — Z793 Long term (current) use of hormonal contraceptives: Secondary | ICD-10-CM | POA: Diagnosis not present

## 2020-06-15 DIAGNOSIS — J45901 Unspecified asthma with (acute) exacerbation: Secondary | ICD-10-CM | POA: Diagnosis present

## 2020-06-15 DIAGNOSIS — T50901A Poisoning by unspecified drugs, medicaments and biological substances, accidental (unintentional), initial encounter: Secondary | ICD-10-CM | POA: Diagnosis not present

## 2020-06-15 DIAGNOSIS — Z881 Allergy status to other antibiotic agents status: Secondary | ICD-10-CM | POA: Diagnosis not present

## 2020-06-15 DIAGNOSIS — E874 Mixed disorder of acid-base balance: Secondary | ICD-10-CM | POA: Diagnosis present

## 2020-06-15 DIAGNOSIS — F419 Anxiety disorder, unspecified: Secondary | ICD-10-CM | POA: Diagnosis present

## 2020-06-15 LAB — COMPREHENSIVE METABOLIC PANEL
ALT: 61 U/L — ABNORMAL HIGH (ref 0–44)
ALT: 56 U/L — ABNORMAL HIGH (ref 0–44)
ALT: 57 U/L — ABNORMAL HIGH (ref 0–44)
ALT: 61 U/L — ABNORMAL HIGH (ref 0–44)
ALT: 61 U/L — ABNORMAL HIGH (ref 0–44)
ALT: 56 U/L — ABNORMAL HIGH (ref 0–44)
ALT: 56 U/L — ABNORMAL HIGH (ref 0–44)
ALT: 53 U/L — ABNORMAL HIGH (ref 0–44)
ALT: 49 U/L — ABNORMAL HIGH (ref 0–44)
ALT: 45 U/L — ABNORMAL HIGH (ref 0–44)
AST: 41 U/L (ref 15–41)
AST: 38 U/L (ref 15–41)
AST: 40 U/L (ref 15–41)
AST: 44 U/L — ABNORMAL HIGH (ref 15–41)
AST: 40 U/L (ref 15–41)
AST: 39 U/L (ref 15–41)
AST: 39 U/L (ref 15–41)
AST: 33 U/L (ref 15–41)
AST: 33 U/L (ref 15–41)
AST: 27 U/L (ref 15–41)
Albumin: 3 g/dL — ABNORMAL LOW (ref 3.5–5.0)
Albumin: 2.9 g/dL — ABNORMAL LOW (ref 3.5–5.0)
Albumin: 3.1 g/dL — ABNORMAL LOW (ref 3.5–5.0)
Albumin: 3.4 g/dL — ABNORMAL LOW (ref 3.5–5.0)
Albumin: 3.3 g/dL — ABNORMAL LOW (ref 3.5–5.0)
Albumin: 3.2 g/dL — ABNORMAL LOW (ref 3.5–5.0)
Albumin: 3.1 g/dL — ABNORMAL LOW (ref 3.5–5.0)
Albumin: 3.1 g/dL — ABNORMAL LOW (ref 3.5–5.0)
Albumin: 3 g/dL — ABNORMAL LOW (ref 3.5–5.0)
Albumin: 3 g/dL — ABNORMAL LOW (ref 3.5–5.0)
Alkaline Phosphatase: 42 U/L (ref 38–126)
Alkaline Phosphatase: 40 U/L (ref 38–126)
Alkaline Phosphatase: 42 U/L (ref 38–126)
Alkaline Phosphatase: 47 U/L (ref 38–126)
Alkaline Phosphatase: 43 U/L (ref 38–126)
Alkaline Phosphatase: 44 U/L (ref 38–126)
Alkaline Phosphatase: 42 U/L (ref 38–126)
Alkaline Phosphatase: 42 U/L (ref 38–126)
Alkaline Phosphatase: 42 U/L (ref 38–126)
Alkaline Phosphatase: 40 U/L (ref 38–126)
Anion gap: 8 (ref 5–15)
Anion gap: 9 (ref 5–15)
Anion gap: 9 (ref 5–15)
Anion gap: 10 (ref 5–15)
Anion gap: 6 (ref 5–15)
Anion gap: 6 (ref 5–15)
Anion gap: 7 (ref 5–15)
Anion gap: 6 (ref 5–15)
Anion gap: 7 (ref 5–15)
Anion gap: 5 (ref 5–15)
BUN: 20 mg/dL (ref 6–20)
BUN: 19 mg/dL (ref 6–20)
BUN: 17 mg/dL (ref 6–20)
BUN: 15 mg/dL (ref 6–20)
BUN: 14 mg/dL (ref 6–20)
BUN: 14 mg/dL (ref 6–20)
BUN: 14 mg/dL (ref 6–20)
BUN: 14 mg/dL (ref 6–20)
BUN: 17 mg/dL (ref 6–20)
BUN: 18 mg/dL (ref 6–20)
CO2: 18 mmol/L — ABNORMAL LOW (ref 22–32)
CO2: 19 mmol/L — ABNORMAL LOW (ref 22–32)
CO2: 17 mmol/L — ABNORMAL LOW (ref 22–32)
CO2: 15 mmol/L — ABNORMAL LOW (ref 22–32)
CO2: 22 mmol/L (ref 22–32)
CO2: 21 mmol/L — ABNORMAL LOW (ref 22–32)
CO2: 21 mmol/L — ABNORMAL LOW (ref 22–32)
CO2: 21 mmol/L — ABNORMAL LOW (ref 22–32)
CO2: 20 mmol/L — ABNORMAL LOW (ref 22–32)
CO2: 22 mmol/L (ref 22–32)
Calcium: 7.4 mg/dL — ABNORMAL LOW (ref 8.9–10.3)
Calcium: 7.5 mg/dL — ABNORMAL LOW (ref 8.9–10.3)
Calcium: 7.6 mg/dL — ABNORMAL LOW (ref 8.9–10.3)
Calcium: 7.8 mg/dL — ABNORMAL LOW (ref 8.9–10.3)
Calcium: 7.6 mg/dL — ABNORMAL LOW (ref 8.9–10.3)
Calcium: 7.6 mg/dL — ABNORMAL LOW (ref 8.9–10.3)
Calcium: 7.6 mg/dL — ABNORMAL LOW (ref 8.9–10.3)
Calcium: 7.7 mg/dL — ABNORMAL LOW (ref 8.9–10.3)
Calcium: 8 mg/dL — ABNORMAL LOW (ref 8.9–10.3)
Calcium: 8.1 mg/dL — ABNORMAL LOW (ref 8.9–10.3)
Chloride: 116 mmol/L — ABNORMAL HIGH (ref 98–111)
Chloride: 115 mmol/L — ABNORMAL HIGH (ref 98–111)
Chloride: 113 mmol/L — ABNORMAL HIGH (ref 98–111)
Chloride: 115 mmol/L — ABNORMAL HIGH (ref 98–111)
Chloride: 115 mmol/L — ABNORMAL HIGH (ref 98–111)
Chloride: 116 mmol/L — ABNORMAL HIGH (ref 98–111)
Chloride: 115 mmol/L — ABNORMAL HIGH (ref 98–111)
Chloride: 113 mmol/L — ABNORMAL HIGH (ref 98–111)
Chloride: 113 mmol/L — ABNORMAL HIGH (ref 98–111)
Chloride: 115 mmol/L — ABNORMAL HIGH (ref 98–111)
Creatinine, Ser: 0.75 mg/dL (ref 0.44–1.00)
Creatinine, Ser: 0.84 mg/dL (ref 0.44–1.00)
Creatinine, Ser: 0.8 mg/dL (ref 0.44–1.00)
Creatinine, Ser: 0.8 mg/dL (ref 0.44–1.00)
Creatinine, Ser: 0.7 mg/dL (ref 0.44–1.00)
Creatinine, Ser: 0.74 mg/dL (ref 0.44–1.00)
Creatinine, Ser: 0.74 mg/dL (ref 0.44–1.00)
Creatinine, Ser: 0.7 mg/dL (ref 0.44–1.00)
Creatinine, Ser: 0.71 mg/dL (ref 0.44–1.00)
Creatinine, Ser: 0.63 mg/dL (ref 0.44–1.00)
GFR, Estimated: >60 mL/min (ref >60)
GFR, Estimated: >60 mL/min (ref >60)
GFR, Estimated: >60 mL/min (ref >60)
GFR, Estimated: >60 mL/min (ref >60)
GFR, Estimated: >60 mL/min (ref >60)
GFR, Estimated: >60 mL/min (ref >60)
GFR, Estimated: >60 mL/min (ref >60)
GFR, Estimated: >60 mL/min (ref >60)
GFR, Estimated: >60 mL/min (ref >60)
GFR, Estimated: >60 mL/min (ref >60)
Glucose, Bld: 112 mg/dL — ABNORMAL HIGH (ref 70–99)
Glucose, Bld: 110 mg/dL — ABNORMAL HIGH (ref 70–99)
Glucose, Bld: 91 mg/dL (ref 70–99)
Glucose, Bld: 114 mg/dL — ABNORMAL HIGH (ref 70–99)
Glucose, Bld: 100 mg/dL — ABNORMAL HIGH (ref 70–99)
Glucose, Bld: 138 mg/dL — ABNORMAL HIGH (ref 70–99)
Glucose, Bld: 142 mg/dL — ABNORMAL HIGH (ref 70–99)
Glucose, Bld: 130 mg/dL — ABNORMAL HIGH (ref 70–99)
Glucose, Bld: 163 mg/dL — ABNORMAL HIGH (ref 70–99)
Glucose, Bld: 107 mg/dL — ABNORMAL HIGH (ref 70–99)
Potassium: 3.3 mmol/L — ABNORMAL LOW (ref 3.5–5.1)
Potassium: 3.2 mmol/L — ABNORMAL LOW (ref 3.5–5.1)
Potassium: 3.5 mmol/L (ref 3.5–5.1)
Potassium: 3.6 mmol/L (ref 3.5–5.1)
Potassium: 3.8 mmol/L (ref 3.5–5.1)
Potassium: 3.6 mmol/L (ref 3.5–5.1)
Potassium: 3.5 mmol/L (ref 3.5–5.1)
Potassium: 3.3 mmol/L — ABNORMAL LOW (ref 3.5–5.1)
Potassium: 3.2 mmol/L — ABNORMAL LOW (ref 3.5–5.1)
Potassium: 3.8 mmol/L (ref 3.5–5.1)
Sodium: 142 mmol/L (ref 135–145)
Sodium: 143 mmol/L (ref 135–145)
Sodium: 139 mmol/L (ref 135–145)
Sodium: 140 mmol/L (ref 135–145)
Sodium: 143 mmol/L (ref 135–145)
Sodium: 143 mmol/L (ref 135–145)
Sodium: 143 mmol/L (ref 135–145)
Sodium: 140 mmol/L (ref 135–145)
Sodium: 140 mmol/L (ref 135–145)
Sodium: 142 mmol/L (ref 135–145)
Total Bilirubin: 0.6 mg/dL (ref 0.3–1.2)
Total Bilirubin: 0.8 mg/dL (ref 0.3–1.2)
Total Bilirubin: 0.5 mg/dL (ref 0.3–1.2)
Total Bilirubin: 0.7 mg/dL (ref 0.3–1.2)
Total Bilirubin: 0.4 mg/dL (ref 0.3–1.2)
Total Bilirubin: 0.4 mg/dL (ref 0.3–1.2)
Total Bilirubin: 0.5 mg/dL (ref 0.3–1.2)
Total Bilirubin: 0.4 mg/dL (ref 0.3–1.2)
Total Bilirubin: 0.4 mg/dL (ref 0.3–1.2)
Total Bilirubin: 0.4 mg/dL (ref 0.3–1.2)
Total Protein: 5.1 g/dL — ABNORMAL LOW (ref 6.5–8.1)
Total Protein: 5.3 g/dL — ABNORMAL LOW (ref 6.5–8.1)
Total Protein: 5.3 g/dL — ABNORMAL LOW (ref 6.5–8.1)
Total Protein: 5.9 g/dL — ABNORMAL LOW (ref 6.5–8.1)
Total Protein: 5.5 g/dL — ABNORMAL LOW (ref 6.5–8.1)
Total Protein: 5.4 g/dL — ABNORMAL LOW (ref 6.5–8.1)
Total Protein: 5.3 g/dL — ABNORMAL LOW (ref 6.5–8.1)
Total Protein: 5.3 g/dL — ABNORMAL LOW (ref 6.5–8.1)
Total Protein: 5.2 g/dL — ABNORMAL LOW (ref 6.5–8.1)
Total Protein: 5 g/dL — ABNORMAL LOW (ref 6.5–8.1)

## 2020-06-15 LAB — CBC WITH DIFFERENTIAL/PLATELET
Abs Immature Granulocytes: 0.02 10*3/uL (ref 0.00–0.07)
Basophils Absolute: 0.1 10*3/uL (ref 0.0–0.1)
Basophils Relative: 1 %
Eosinophils Absolute: 0.1 10*3/uL (ref 0.0–0.5)
Eosinophils Relative: 2 %
HCT: 35.1 % — ABNORMAL LOW (ref 36.0–46.0)
Hemoglobin: 11.8 g/dL — ABNORMAL LOW (ref 12.0–15.0)
Immature Granulocytes: 0 %
Lymphocytes Relative: 35 %
Lymphs Abs: 2.2 10*3/uL (ref 0.7–4.0)
MCH: 29.6 pg (ref 26.0–34.0)
MCHC: 33.6 g/dL (ref 30.0–36.0)
MCV: 88 fL (ref 80.0–100.0)
Monocytes Absolute: 0.5 10*3/uL (ref 0.1–1.0)
Monocytes Relative: 8 %
Neutro Abs: 3.5 10*3/uL (ref 1.7–7.7)
Neutrophils Relative %: 54 %
Platelets: 301 10*3/uL (ref 150–400)
RBC: 3.99 MIL/uL (ref 3.87–5.11)
RDW: 15.6 % — ABNORMAL HIGH (ref 11.5–15.5)
WBC: 6.4 10*3/uL (ref 4.0–10.5)
nRBC: 0 % (ref 0.0–0.2)

## 2020-06-15 LAB — GLUCOSE, CAPILLARY
Glucose-Capillary: 102 mg/dL — ABNORMAL HIGH (ref 70–99)
Glucose-Capillary: 113 mg/dL — ABNORMAL HIGH (ref 70–99)
Glucose-Capillary: 114 mg/dL — ABNORMAL HIGH (ref 70–99)
Glucose-Capillary: 116 mg/dL — ABNORMAL HIGH (ref 70–99)
Glucose-Capillary: 138 mg/dL — ABNORMAL HIGH (ref 70–99)
Glucose-Capillary: 139 mg/dL — ABNORMAL HIGH (ref 70–99)

## 2020-06-15 LAB — RESP PANEL BY RT-PCR (FLU A&B, COVID) ARPGX2
Influenza A by PCR: NEGATIVE
Influenza B by PCR: NEGATIVE
SARS Coronavirus 2 by RT PCR: NEGATIVE

## 2020-06-15 LAB — URINE DRUG SCREEN, QUALITATIVE (ARMC ONLY)
Amphetamines, Ur Screen: NOT DETECTED
Barbiturates, Ur Screen: NOT DETECTED
Benzodiazepine, Ur Scrn: NOT DETECTED
Cocaine Metabolite,Ur ~~LOC~~: NOT DETECTED
MDMA (Ecstasy)Ur Screen: NOT DETECTED
Methadone Scn, Ur: NOT DETECTED
Opiate, Ur Screen: NOT DETECTED
Phencyclidine (PCP) Ur S: NOT DETECTED
Tricyclic, Ur Screen: NOT DETECTED

## 2020-06-15 LAB — SALICYLATE LEVEL
Salicylate Lvl: 45.3 mg/dL (ref 7.0–30.0)
Salicylate Lvl: 40.1 mg/dL (ref 7.0–30.0)
Salicylate Lvl: 35.2 mg/dL — ABNORMAL HIGH (ref 7.0–30.0)
Salicylate Lvl: 37.9 mg/dL — ABNORMAL HIGH (ref 7.0–30.0)
Salicylate Lvl: 33.4 mg/dL — ABNORMAL HIGH (ref 7.0–30.0)
Salicylate Lvl: 27.4 mg/dL (ref 7.0–30.0)
Salicylate Lvl: 22.1 mg/dL (ref 7.0–30.0)
Salicylate Lvl: 19.2 mg/dL (ref 7.0–30.0)
Salicylate Lvl: 14.1 mg/dL (ref 7.0–30.0)
Salicylate Lvl: 10.3 mg/dL (ref 7.0–30.0)

## 2020-06-15 LAB — PROTIME-INR
INR: 1.3 — ABNORMAL HIGH (ref 0.8–1.2)
Prothrombin Time: 15.6 seconds — ABNORMAL HIGH (ref 11.4–15.2)

## 2020-06-15 LAB — ACETAMINOPHEN LEVEL: Acetaminophen (Tylenol), Serum: <10 ug/mL — ABNORMAL LOW (ref 10–30)

## 2020-06-15 LAB — HIV ANTIBODY (ROUTINE TESTING W REFLEX): HIV Screen 4th Generation wRfx: NONREACTIVE

## 2020-06-15 MED ORDER — PREDNISONE 20 MG PO TABS
40.0000 mg | ORAL_TABLET | Freq: Every day | ORAL | Status: DC
  Administered 2020-06-15 – 2020-06-16 (×2): 40 mg via ORAL
  Filled 2020-06-15 (×2): qty 2

## 2020-06-15 MED ORDER — POTASSIUM CHLORIDE CRYS ER 20 MEQ PO TBCR
40.0000 meq | EXTENDED_RELEASE_TABLET | Freq: Once | ORAL | Status: AC
  Administered 2020-06-15: 40 meq via ORAL
  Filled 2020-06-15: qty 2

## 2020-06-15 MED ORDER — CYCLOBENZAPRINE HCL 10 MG PO TABS
5.0000 mg | ORAL_TABLET | Freq: Three times a day (TID) | ORAL | Status: DC | PRN
  Administered 2020-06-15 (×2): 5 mg via ORAL
  Filled 2020-06-15 (×2): qty 1

## 2020-06-15 MED ORDER — PANTOPRAZOLE SODIUM 40 MG PO TBEC
40.0000 mg | DELAYED_RELEASE_TABLET | Freq: Every day | ORAL | Status: DC
  Administered 2020-06-15 – 2020-06-16 (×2): 40 mg via ORAL
  Filled 2020-06-15 (×2): qty 1

## 2020-06-15 MED ORDER — ONDANSETRON HCL 4 MG/2ML IJ SOLN: 4.0000 mg | Freq: Four times a day (QID) | INTRAMUSCULAR | Status: DC | PRN

## 2020-06-15 MED ORDER — ONDANSETRON HCL 4 MG PO TABS
4.0000 mg | ORAL_TABLET | Freq: Four times a day (QID) | ORAL | Status: DC | PRN
  Administered 2020-06-15: 4 mg via ORAL
  Filled 2020-06-15: qty 1

## 2020-06-15 MED ORDER — GUAIFENESIN ER 600 MG PO TB12
600.0000 mg | ORAL_TABLET | Freq: Two times a day (BID) | ORAL | Status: DC
  Administered 2020-06-15 – 2020-06-16 (×3): 600 mg via ORAL
  Filled 2020-06-15 (×3): qty 1

## 2020-06-15 MED ORDER — PRENATAL MULTIVITAMIN CH
1.0000 | ORAL_TABLET | Freq: Every day | ORAL | Status: DC
  Administered 2020-06-15 – 2020-06-16 (×2): 1 via ORAL
  Filled 2020-06-15 (×2): qty 1

## 2020-06-15 MED ORDER — ALBUTEROL SULFATE (2.5 MG/3ML) 0.083% IN NEBU
2.5000 mg | INHALATION_SOLUTION | RESPIRATORY_TRACT | Status: DC | PRN
  Filled 2020-06-15: qty 3

## 2020-06-15 MED ORDER — LEVETIRACETAM 500 MG PO TABS
500.0000 mg | ORAL_TABLET | Freq: Three times a day (TID) | ORAL | Status: DC
  Administered 2020-06-15 – 2020-06-16 (×4): 500 mg via ORAL
  Filled 2020-06-15 (×4): qty 1

## 2020-06-15 NOTE — ED Notes (Signed)
Recollect on UDS per lab.

## 2020-06-15 NOTE — Progress Notes (Signed)
poison control called to follow up on pt / improved labs values given / poison control states they are no longer following pt case due to improvement/ will continue to monitor.

## 2020-06-15 NOTE — Progress Notes (Signed)
5:54 AM  06/15/2020  Test: salicylate level Critical Value: 45.3  Name of Provider Notified: Para March  Orders Received? Or Actions Taken?: awaiting response

## 2020-06-15 NOTE — ED Notes (Signed)
Patient ok to be transported per Melvenia Beam, Charity fundraiser.

## 2020-06-15 NOTE — ED Notes (Signed)
Patient transported to inpatient unit by this RN. 

## 2020-06-15 NOTE — Progress Notes (Signed)
Patient seen and examined this morning, admitted overnight, H&P reviewed and agree with assessment and plan.  35 year old female with history of anxiety, depression, history of intermittent asthma, tobacco use, seizure disorder on Keppra, comes into the hospital with tightness of her chest, nonproductive cough, sinus pressure which has been going on for the past week.  She reported fever, chills, sweats.  She took over-the-counter medications to help with her symptoms, initially felt better but then started feeling worse again and describes generalized muscle pains and muscle "tightness".  In the ED she was found to be quite tachypneic, and a salicylate level was found to be elevated at 60.5.  She did admit to [facility] powder use.  She was started on bicarb infusion and admitted to the hospital.  Denies intentional overdose   Principal problem Salicylate overdose-continue bicarb infusion, acidosis slightly worse this morning increase the bicarb infusion rate.  Goal salicylate level less than 35 and bicarb greater than 19.  Active problems Mildly elevated LFTs-Tylenol level negative, overall stable, monitor  Shortness of breath/congestion/cough-reports a nonproductive cough, perhaps a viral illness.  She is Covid negative.  Symptomatic treatment, short course of prednisone  Seizure disorder-continue Keppra  Scheduled Meds: . guaiFENesin  600 mg Oral BID  . levETIRAcetam  500 mg Oral TID  . pantoprazole  40 mg Oral Daily  . predniSONE  40 mg Oral Q breakfast  . prenatal multivitamin  1 tablet Oral Q0600   Continuous Infusions: . sodium bicarbonate (isotonic) 150 mEq in D5W 1000 mL infusion 150 mL/hr at 06/15/20 1014   PRN Meds:.albuterol, cyclobenzaprine, ondansetron **OR** ondansetron (ZOFRAN) IV    Apolonia Ellwood M. Elvera Lennox, MD, PhD [facility]  Between 7 am - 7 pm you can contact me via Amion (for emergencies) or Securechat (for non urgent matters) I am not available 7 pm - 7 am, please  contact night coverage MD/APP via Amion

## 2020-06-15 NOTE — H&P (Signed)
History and Physical    Gwendolyn Gonzales JKK:[id number] DOB: November 20, 1985 DOA: 06/14/2020  PCP: Patient, No Pcp Per (Inactive)  Patient coming from: home  I have personally briefly reviewed patient's old medical records in [facility]  Chief Complaint:  Chest pain  HPI: Gwendolyn Gonzales is a 35 y.o. 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 On evaluation patient found to have low bicarb with respiratory alkalosis, labs ordered notable for salicylate level of 60.5. On further questioning patient denies polysubstance abuse( prelim urine drug tox notes otherwise full report pending) but she does admit to frequent use of BC powder. Poison control was contacted and recommendations appreciated.  ED Course:  Vs: 98.9, bp104/73, hr 81, rr 22 Wbc:5.5, hgb14.4, plt371 NA:139, K3.4, CL116, Co211,glu 112, cal 8.5 cr0.89 CK:226 Ce:3,2 Vbg:7.44,19 Ast56,81 Salicylate:60.5 resp panel:neg  Cxr: NAD  Tx: 1 amp bicarb, bicarb drip Review of Systems: As per HPI otherwise 10 point review of systems negative.   Past Medical History:  Diagnosis Date  . Anxiety   . Depression   . History of abnormal cervical Pap smear    "years ago"  . History of asthma    as child  . History of chlamydia   . History of urinary tract infection   . Seizure (HCC)    07/2019    Past Surgical History:  Procedure Laterality Date  . CESAREAN SECTION    . CESAREAN SECTION  15 May 2006  . FLEXIBLE BRONCHOSCOPY W/ UPPER ENDOSCOPY    . INNER EAR SURGERY       reports that she has been smoking cigarettes. She has a 5.25 pack-year smoking history. She has quit using smokeless tobacco. She reports previous alcohol use.  She reports previous drug use. Drugs: Methamphetamines and Marijuana.  Allergies  Allergen Reactions  . Augmentin [Amoxicillin-Pot Clavulanate] Hives  . Lactose Intolerance (Gi) Nausea And Vomiting  . Biaxin [Clarithromycin] Rash  . Latex Rash    Family History  Problem Relation Age of Onset  . Throat cancer Father   . Lung cancer Father   . Breast cancer Paternal Aunt   . Ovarian cancer Paternal Aunt   . Throat cancer Paternal Aunt   . Lung cancer Paternal Aunt   . Hypertension Mother   . Anxiety disorder Mother   . High Cholesterol Mother   . Hypertension Maternal Grandmother   . High Cholesterol Maternal Grandmother   . Leukemia Maternal Grandmother   . Diabetes Maternal Grandmother   . Hypertension Maternal Grandfather   . High Cholesterol Maternal Grandfather     Prior to Admission medications   Medication Sig Start Date End Date Taking? Authorizing Provider  acetaminophen (TYLENOL) 325 MG tablet Take 2 tablets (650 mg total) by mouth every 6 (six) hours as needed for mild pain (or Fever >/= 101). 06/13/17   Auburn Bilberry, MD  ARIPiprazole (ABILIFY) 30 MG tablet Take 30 mg by mouth daily.    [provider]  levETIRAcetam (KEPPRA) 500 MG tablet Take 1 tablet (500 mg total) by mouth 2 (two) times daily. Patient taking differently: Take 500 mg by mouth in the morning, at noon, and at bedtime.  08/01/19   Emily Filbert, MD  medroxyPROGESTERone (DEPO-PROVERA) 150 MG/ML  injection Inject 150 mg into the muscle every 3 (three) months. Patient not taking: Reported on 09/19/2019    [provider]  OLANZapine zydis (ZYPREXA) 5 MG disintegrating tablet Take 1 tablet (5 mg total) by mouth at bedtime. Patient not taking: Reported on 06/11/2017 06/01/16   Jimmy Footman, MD  pantoprazole (PROTONIX) 40 MG tablet Take 1 tablet (40 mg total) by mouth daily. 06/13/17 01/24/20  Auburn Bilberry, MD  Prenatal Vit-Fe Fumarate-FA (PRENATAL VITAMIN) 27-0.8 MG  TABS Take 1 tablet by mouth daily at 6 (six) AM. 09/19/19   Federico Flake, MD    Physical Exam: Vitals:   06/14/20 2200 06/14/20 2300 06/14/20 2330 06/15/20 0040  BP: 110/71 114/67 110/73   Pulse: 93 84 89 88  Resp: 20 (!) 21    Temp:      TempSrc:      SpO2: 99% 99% 98% 100%  Weight:      Height:         Vitals:   06/14/20 2200 06/14/20 2300 06/14/20 2330 06/15/20 0040  BP: 110/71 114/67 110/73   Pulse: 93 84 89 88  Resp: 20 (!) 21    Temp:      TempSrc:      SpO2: 99% 99% 98% 100%  Weight:      Height:      Constitutional: NAD, calm, comfortable Eyes: PERRL, lids and conjunctivae normal ENMT: Mucous membranes are moist. Posterior pharynx clear of any exudate or lesions.Normal dentition.  Neck: normal, supple, no masses, no thyromegaly Respiratory: coarse /vesicular bs, rare wheezing, no crackles. Normal respiratory effort. No accessory muscle use.  Cardiovascular: Regular rate and rhythm, no murmurs / rubs / gallops. No extremity edema. 2+ pedal pulses. No carotid bruits.  Abdomen: no tenderness, no masses palpated. No hepatosplenomegaly. Bowel sounds positive.  Musculoskeletal: no clubbing / cyanosis. No joint deformity upper and lower extremities. Good ROM, no contractures. Normal muscle tone.  Skin: no rashes, lesions, ulcers. No induration Neurologic: CN 2-12 grossly intact. Sensation intact, DTR normal. Strength 5/5 in all 4.  Psychiatric: Normal judgment and insight. Alert and oriented x 3. Normal mood.    Labs on Admission: I have personally reviewed following labs and imaging studies  CBC: Recent Labs  Lab 06/14/20 1714  WBC 5.5  HGB 14.4  HCT 42.3  MCV 87.4  PLT 371   Basic Metabolic Panel: Recent Labs  Lab 06/14/20 1714  NA 139  K 3.4*  CL 116*  CO2 11*  GLUCOSE 112*  BUN 18  CREATININE 0.89  CALCIUM 8.5*   GFR: Estimated Creatinine Clearance: 71.1 mL/min (by C-G formula based on SCr of 0.89 mg/dL). Liver Function Tests: Recent  Labs  Lab 06/14/20 2217  AST 56*  ALT 81*  ALKPHOS 50  BILITOT 0.7  PROT 6.6  ALBUMIN 3.8   Recent Labs  Lab 06/14/20 2217  LIPASE 47   No results for input(s): AMMONIA in the last 168 hours. Coagulation Profile: No results for input(s): INR, PROTIME in the last 168 hours. Cardiac Enzymes: Recent Labs  Lab 06/14/20 1714  CKTOTAL 226   BNP (last 3 results) No results for input(s): PROBNP in the last 8760 hours. HbA1C: No results for input(s): HGBA1C in the last 72 hours. CBG: No results for input(s): GLUCAP in the last 168 hours. Lipid Profile: No results for input(s): CHOL, HDL, LDLCALC, TRIG, CHOLHDL, LDLDIRECT in the last 72 hours. Thyroid Function Tests: No results for input(s): TSH, T4TOTAL, FREET4, T3FREE, THYROIDAB in the  last 72 hours. Anemia Panel: No results for input(s): VITAMINB12, FOLATE, FERRITIN, TIBC, IRON, RETICCTPCT in the last 72 hours. Urine analysis:    Component Value Date/Time   COLORURINE RED (A) 08/01/2019 1102   APPEARANCEUR HAZY (A) 08/01/2019 1102   APPEARANCEUR Clear 02/08/2014 1550   LABSPEC 1.010 08/01/2019 1102   LABSPEC 1.019 02/08/2014 1550   PHURINE  08/01/2019 1102    TEST NOT REPORTED DUE TO COLOR INTERFERENCE OF URINE PIGMENT   GLUCOSEU (A) 08/01/2019 1102    TEST NOT REPORTED DUE TO COLOR INTERFERENCE OF URINE PIGMENT   GLUCOSEU Negative 02/08/2014 1550   HGBUR (A) 08/01/2019 1102    TEST NOT REPORTED DUE TO COLOR INTERFERENCE OF URINE PIGMENT   BILIRUBINUR (A) 08/01/2019 1102    TEST NOT REPORTED DUE TO COLOR INTERFERENCE OF URINE PIGMENT   BILIRUBINUR Negative 02/08/2014 1550   KETONESUR (A) 08/01/2019 1102    TEST NOT REPORTED DUE TO COLOR INTERFERENCE OF URINE PIGMENT   PROTEINUR (A) 08/01/2019 1102    TEST NOT REPORTED DUE TO COLOR INTERFERENCE OF URINE PIGMENT   NITRITE (A) 08/01/2019 1102    TEST NOT REPORTED DUE TO COLOR INTERFERENCE OF URINE PIGMENT   LEUKOCYTESUR (A) 08/01/2019 1102    TEST NOT REPORTED DUE  TO COLOR INTERFERENCE OF URINE PIGMENT   LEUKOCYTESUR Negative 02/08/2014 1550    Radiological Exams on Admission: DG Chest 2 View  Result Date: 06/14/2020 CLINICAL DATA:  Cough, chest pain, night sweats, fevers and shortness of breath EXAM: CHEST - 2 VIEW COMPARISON:  Radiograph 06/11/2017 FINDINGS: No consolidation, features of edema, pneumothorax, or effusion. Pulmonary vascularity is normally distributed. The cardiomediastinal contours are unremarkable. No acute osseous or soft tissue abnormality. Bilateral metallic nipple ornamentation. IMPRESSION: No acute cardiopulmonary abnormality. Electronically Signed   By: Kreg Shropshire M.D.   On: 06/14/2020 17:39    EKG: Independently reviewed. snr  Assessment/Plan  Salicylate Overdose  -level of 60  -poison control contacted  -patient is s/p 1 amp bicarb and is on bicarb drip  -repeat labs q2h  -goal salicylate level less than 35 , and bicarb >19 -monitor on tele and continuous pulse ox, sz precautions  - urine drug tox pending ,as well as tylenol level  -renal consult resulted  -admit to imcu   Mild elevated lfts -monitor labs  -tylenol level pending  -mild ruq on exam   Chest pain Lennette Bihari Leeanne Rio -? Asthma exacerbation/viral syndrome -? shob related to salicylate toxicity  -nebs prn , prednisone 40mg   Daily for asthma exacerbation -monitor continuous pulse ox  -currently sat stable and cxr NAD  -supportive care  Concern for polysubstance abuse -etoh level negative -f/u uds  -patient denies  -prelim uds + , final result pending  -monitor for w/d   Hx of Sz d/o -continue on keppra   Hx of Depression/anxiety  -resume home meds in am as able   GERD -ppi   DVT prophylaxis:scd  Code Status: full Family Communication: n/a Disposition Plan: patient  expected to be admitted greater than 2 midnights Consults called: renal  Admission status: inpatient  MD [facility]  If 7PM-7AM, please  contact night-coverage [email or web address] Password [facility]  06/15/2020, 12:47 AM

## 2020-06-16 DIAGNOSIS — T39091A Poisoning by salicylates, accidental (unintentional), initial encounter: Secondary | ICD-10-CM | POA: Diagnosis not present

## 2020-06-16 DIAGNOSIS — R079 Chest pain, unspecified: Secondary | ICD-10-CM | POA: Diagnosis not present

## 2020-06-16 LAB — COMPREHENSIVE METABOLIC PANEL
ALT: 42 U/L (ref 0–44)
ALT: 42 U/L (ref 0–44)
ALT: 42 U/L (ref 0–44)
ALT: 44 U/L (ref 0–44)
ALT: 43 U/L (ref 0–44)
AST: 27 U/L (ref 15–41)
AST: 27 U/L (ref 15–41)
AST: 31 U/L (ref 15–41)
AST: 31 U/L (ref 15–41)
AST: 33 U/L (ref 15–41)
Albumin: 3 g/dL — ABNORMAL LOW (ref 3.5–5.0)
Albumin: 2.9 g/dL — ABNORMAL LOW (ref 3.5–5.0)
Albumin: 2.8 g/dL — ABNORMAL LOW (ref 3.5–5.0)
Albumin: 2.9 g/dL — ABNORMAL LOW (ref 3.5–5.0)
Albumin: 2.9 g/dL — ABNORMAL LOW (ref 3.5–5.0)
Alkaline Phosphatase: 41 U/L (ref 38–126)
Alkaline Phosphatase: 39 U/L (ref 38–126)
Alkaline Phosphatase: 39 U/L (ref 38–126)
Alkaline Phosphatase: 39 U/L (ref 38–126)
Alkaline Phosphatase: 38 U/L (ref 38–126)
Anion gap: 7 (ref 5–15)
Anion gap: 6 (ref 5–15)
Anion gap: 7 (ref 5–15)
Anion gap: 6 (ref 5–15)
Anion gap: 7 (ref 5–15)
BUN: 19 mg/dL (ref 6–20)
BUN: 19 mg/dL (ref 6–20)
BUN: 18 mg/dL (ref 6–20)
BUN: 17 mg/dL (ref 6–20)
BUN: 16 mg/dL (ref 6–20)
CO2: 22 mmol/L (ref 22–32)
CO2: 22 mmol/L (ref 22–32)
CO2: 22 mmol/L (ref 22–32)
CO2: 23 mmol/L (ref 22–32)
CO2: 23 mmol/L (ref 22–32)
Calcium: 8.1 mg/dL — ABNORMAL LOW (ref 8.9–10.3)
Calcium: 8 mg/dL — ABNORMAL LOW (ref 8.9–10.3)
Calcium: 7.9 mg/dL — ABNORMAL LOW (ref 8.9–10.3)
Calcium: 8 mg/dL — ABNORMAL LOW (ref 8.9–10.3)
Calcium: 8 mg/dL — ABNORMAL LOW (ref 8.9–10.3)
Chloride: 112 mmol/L — ABNORMAL HIGH (ref 98–111)
Chloride: 112 mmol/L — ABNORMAL HIGH (ref 98–111)
Chloride: 112 mmol/L — ABNORMAL HIGH (ref 98–111)
Chloride: 111 mmol/L (ref 98–111)
Chloride: 112 mmol/L — ABNORMAL HIGH (ref 98–111)
Creatinine, Ser: 0.62 mg/dL (ref 0.44–1.00)
Creatinine, Ser: 0.72 mg/dL (ref 0.44–1.00)
Creatinine, Ser: 0.71 mg/dL (ref 0.44–1.00)
Creatinine, Ser: 0.58 mg/dL (ref 0.44–1.00)
Creatinine, Ser: 0.59 mg/dL (ref 0.44–1.00)
GFR, Estimated: >60 mL/min (ref >60)
GFR, Estimated: >60 mL/min (ref >60)
GFR, Estimated: >60 mL/min (ref >60)
GFR, Estimated: >60 mL/min (ref >60)
GFR, Estimated: >60 mL/min (ref >60)
Glucose, Bld: 120 mg/dL — ABNORMAL HIGH (ref 70–99)
Glucose, Bld: 103 mg/dL — ABNORMAL HIGH (ref 70–99)
Glucose, Bld: 108 mg/dL — ABNORMAL HIGH (ref 70–99)
Glucose, Bld: 103 mg/dL — ABNORMAL HIGH (ref 70–99)
Glucose, Bld: 80 mg/dL (ref 70–99)
Potassium: 3.6 mmol/L (ref 3.5–5.1)
Potassium: 3.5 mmol/L (ref 3.5–5.1)
Potassium: 3.4 mmol/L — ABNORMAL LOW (ref 3.5–5.1)
Potassium: 3.5 mmol/L (ref 3.5–5.1)
Potassium: 3.4 mmol/L — ABNORMAL LOW (ref 3.5–5.1)
Sodium: 141 mmol/L (ref 135–145)
Sodium: 140 mmol/L (ref 135–145)
Sodium: 141 mmol/L (ref 135–145)
Sodium: 140 mmol/L (ref 135–145)
Sodium: 142 mmol/L (ref 135–145)
Total Bilirubin: 0.4 mg/dL (ref 0.3–1.2)
Total Bilirubin: 0.3 mg/dL (ref 0.3–1.2)
Total Bilirubin: 0.4 mg/dL (ref 0.3–1.2)
Total Bilirubin: 0.3 mg/dL (ref 0.3–1.2)
Total Bilirubin: 0.2 mg/dL — ABNORMAL LOW (ref 0.3–1.2)
Total Protein: 5.2 g/dL — ABNORMAL LOW (ref 6.5–8.1)
Total Protein: 5 g/dL — ABNORMAL LOW (ref 6.5–8.1)
Total Protein: 4.8 g/dL — ABNORMAL LOW (ref 6.5–8.1)
Total Protein: 4.8 g/dL — ABNORMAL LOW (ref 6.5–8.1)
Total Protein: 5 g/dL — ABNORMAL LOW (ref 6.5–8.1)

## 2020-06-16 LAB — SALICYLATE LEVEL
Salicylate Lvl: 7.9 mg/dL (ref 7.0–30.0)
Salicylate Lvl: 8.3 mg/dL (ref 7.0–30.0)
Salicylate Lvl: 7.7 mg/dL (ref 7.0–30.0)
Salicylate Lvl: 7.1 mg/dL (ref 7.0–30.0)
Salicylate Lvl: <7 mg/dL — ABNORMAL LOW (ref 7.0–30.0)

## 2020-06-16 LAB — GLUCOSE, CAPILLARY
Glucose-Capillary: 111 mg/dL — ABNORMAL HIGH (ref 70–99)
Glucose-Capillary: 79 mg/dL (ref 70–99)

## 2020-06-16 MED ORDER — POTASSIUM CHLORIDE CRYS ER 20 MEQ PO TBCR
30.0000 meq | EXTENDED_RELEASE_TABLET | Freq: Once | ORAL | Status: AC
  Administered 2020-06-16: 30 meq via ORAL
  Filled 2020-06-16: qty 1

## 2020-06-16 MED ORDER — CYCLOBENZAPRINE HCL 10 MG PO TABS: 5.0000 mg | ORAL_TABLET | Freq: Three times a day (TID) | ORAL | 0 refills | Status: DC | PRN

## 2020-06-16 MED ORDER — LEVETIRACETAM 500 MG PO TABS: 500.0000 mg | ORAL_TABLET | Freq: Three times a day (TID) | ORAL | Status: AC

## 2020-06-16 MED ORDER — ALBUTEROL SULFATE HFA 108 (90 BASE) MCG/ACT IN AERS: 2.0000 | INHALATION_SPRAY | Freq: Four times a day (QID) | RESPIRATORY_TRACT | 2 refills | Status: AC | PRN

## 2020-06-16 NOTE — Discharge Summary (Signed)
Physician Discharge Summary  Gwendolyn Gonzales BOF:[id number] DOB: 09/20/1985 DOA: 06/14/2020  PCP: Patient, No Pcp Per (Inactive)  Admit date: 06/14/2020 Discharge date: 06/16/2020  Admitted From: home Disposition:  home  Recommendations for Outpatient Follow-up:  1. Follow up with PCP in 1-2 weeks  Home Health: none Equipment/Devices: none  Discharge Condition: stable CODE STATUS: Full code Diet recommendation: regular  HPI: Per admitting MD, Gwendolyn Gonzales is a 35 y.o. 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 On evaluation patient found to have low bicarb with respiratory alkalosis, labs ordered notable for salicylate level of 60.5. On further questioning patient denies polysubstance abuse( prelim urine drug tox notes otherwise full report pending) but she does admit to frequent use of BC powder. Poison control was contacted and recommendations appreciated.  Hospital Course / Discharge diagnoses: Principal problem Salicylate overdose-patient was admitted to the hospital with salicylate overdose, metabolic acidosis and respiratory alkalosis.  Poison control was consulted, as well as nephrology to be on standby.  She was placed on bicarbonate infusion with subsequent improvement in her acidosis, her bicarbonate normalized as well as salicylate level became undetectable.  Clinically she has improved, returned to baseline, and will be discharged home in stable condition.  She was extensively counseled regarding avoidance of products that contain salicylic acid.  Active problems Mildly elevated LFTs-Tylenol level negative, improving, possibly related to #1 Shortness of  breath/congestion/cough-reports a nonproductive cough, perhaps a viral illness.  She is Covid negative.  Symptomatic treatment, received a short course of prednisone in the hospital.  She is not wheezing and he is on room air Seizure disorder-continue home medications  Sepsis ruled out   Discharge Instructions   Allergies as of 06/16/2020      Reactions   Augmentin [amoxicillin-pot Clavulanate] Hives   Lactose Intolerance (gi) Nausea And Vomiting   Biaxin [clarithromycin] Rash   Latex Rash      Medication List    TAKE these medications   acetaminophen 325 MG tablet Commonly known as: TYLENOL Take 2 tablets (650 mg total) by mouth every 6 (six) hours as needed for mild pain (or Fever >/= 101).   albuterol 108 (90 Base) MCG/ACT inhaler Commonly known as: VENTOLIN HFA Inhale 2 puffs into the lungs every 6 (six) hours as needed for wheezing or shortness of breath.   ARIPiprazole 30 MG tablet Commonly known as: ABILIFY Take 30 mg by mouth daily.   cyclobenzaprine 10 MG tablet Commonly known as: FLEXERIL Take 0.5 tablets (5 mg total) by mouth 3 (three) times daily as needed for muscle spasms.   levETIRAcetam 500 MG tablet Commonly known as: Keppra Take 1 tablet (500 mg total) by mouth in the morning, at noon, and at bedtime.   medroxyPROGESTERone 150 MG/ML injection Commonly known as: DEPO-PROVERA Inject 150 mg into the muscle every 3 (three) months.   OLANZapine zydis 5 MG disintegrating tablet Commonly known as: ZYPREXA Take 1 tablet (5 mg total) by mouth at bedtime.   pantoprazole 40 MG tablet Commonly known as: Protonix Take 1 tablet (40 mg total) by mouth daily.   Prenatal Vitamin 27-0.8 MG Tabs Take 1 tablet by mouth daily at 6 (six) AM.       Consultations:  none  Procedures/Studies:  DG Chest 2 View  Result Date: 06/14/2020 CLINICAL DATA:  Cough, chest pain, night sweats, fevers and shortness of breath EXAM: CHEST - 2 VIEW COMPARISON:  Radiograph  06/11/2017 FINDINGS: No consolidation, features of edema, pneumothorax, or effusion. Pulmonary vascularity is normally distributed. The cardiomediastinal contours are unremarkable. No acute osseous or soft tissue abnormality. Bilateral metallic nipple ornamentation. IMPRESSION: No acute cardiopulmonary abnormality. Electronically Signed   By: Kreg Shropshire M.D.   On: 06/14/2020 17:39      Subjective: - no chest pain, shortness of breath, no abdominal pain, nausea or vomiting.   Discharge Exam: BP 100/66 (BP Location: Right Arm)   Pulse 64   Temp 98.7 F (37.1 C) (Oral)   Resp 16   Ht 5\' 2"  (1.575 m)   Wt 62 kg   LMP 08/01/2019 (Exact Date)   SpO2 94%   Breastfeeding Unknown   BMI 25.00 kg/m   General: Pt is alert, awake, not in acute distress Cardiovascular: RRR, S1/S2 +, no rubs, no gallops Respiratory: CTA bilaterally, no wheezing, no rhonchi Abdominal: Soft, NT, ND, bowel sounds + Extremities: no edema, no cyanosis    The results of significant diagnostics from this hospitalization (including imaging, microbiology, ancillary and laboratory) are listed below for reference.     Microbiology: Recent Results (from the past 240 hour(s))  Resp Panel by RT-PCR (Flu A&B, Covid) Nasopharyngeal Swab     Status: None   Collection Time: 06/14/20 11:40 PM   Specimen: Nasopharyngeal Swab; Nasopharyngeal(NP) swabs in vial transport medium  Result Value Ref Range Status   SARS Coronavirus 2 by RT PCR NEGATIVE NEGATIVE Final    Comment: (NOTE) SARS-CoV-2 target nucleic acids are NOT DETECTED.  The SARS-CoV-2 RNA is generally detectable in upper respiratory specimens during the acute phase of infection. The lowest concentration of SARS-CoV-2 viral copies this assay can detect is 138 copies/mL. A negative result does not preclude SARS-Cov-2 infection and should not be used as the sole basis for treatment or other patient management decisions. A negative result may occur with  improper  specimen collection/handling, submission of specimen other than nasopharyngeal swab, presence of viral mutation(s) within the areas targeted by this assay, and inadequate number of viral copies(<138 copies/mL). A negative result must be combined with clinical observations, patient history, and epidemiological information. The expected result is Negative.  Fact Sheet for Patients:  08/14/20  Fact Sheet for Healthcare Providers:  BloggerCourse.com  This test is no t yet approved or cleared by the [employer] FDA and  has been authorized for detection and/or diagnosis of SARS-CoV-2 by FDA under an Emergency Use Authorization (EUA). This EUA will remain  in effect (meaning this test can be used) for the duration of the COVID-19 declaration under Section 564(b)(1) of the Act, 21 U.S.C.section 360bbb-3(b)(1), unless the authorization is terminated  or revoked sooner.       Influenza A by PCR NEGATIVE NEGATIVE Final   Influenza B by PCR NEGATIVE NEGATIVE Final    Comment: (NOTE) The Xpert Xpress SARS-CoV-2/FLU/RSV plus assay is intended as an aid in the diagnosis of influenza from Nasopharyngeal swab specimens and should not be used as a sole basis for treatment. Nasal washings and aspirates are unacceptable for Xpert Xpress SARS-CoV-2/FLU/RSV testing.  Fact Sheet for Patients: [place]  Fact Sheet for Healthcare Providers: BloggerCourse.com  This test is not yet approved or cleared by the [employer] FDA and has been authorized for detection and/or diagnosis of SARS-CoV-2 by FDA under an Emergency Use  Authorization (EUA). This EUA will remain in effect (meaning this test can be used) for the duration of the COVID-19 declaration under Section 564(b)(1) of the Act, 21 U.S.C. section 360bbb-3(b)(1), unless the authorization is terminated or revoked.  Performed at  [facility], [address] [place], [place] [address]      Labs: Basic Metabolic Panel: Recent Labs  Lab 06/16/20 0106 06/16/20 0223 06/16/20 0436 06/16/20 0637 06/16/20 0758  NA 141 140 141 140 142  K 3.6 3.5 3.4* 3.5 3.4*  CL 112* 112* 112* 111 112*  CO2 22 22 22 23 23   GLUCOSE 120* 103* 108* 103* 80  BUN 19 19 18 17 16   CREATININE 0.62 0.72 0.71 0.58 0.59  CALCIUM 8.1* 8.0* 7.9* 8.0* 8.0*   Liver Function Tests: Recent Labs  Lab 06/16/20 0106 06/16/20 0223 06/16/20 0436 06/16/20 0637 06/16/20 0758  AST 27 27 31 31  33  ALT 42 42 42 44 43  ALKPHOS 41 39 39 39 38  BILITOT 0.4 0.3 0.4 0.3 0.2*  PROT 5.2* 5.0* 4.8* 4.8* 5.0*  ALBUMIN 3.0* 2.9* 2.8* 2.9* 2.9*   CBC: Recent Labs  Lab 06/14/20 1714 06/15/20 0433  WBC 5.5 6.4  NEUTROABS  --  3.5  HGB 14.4 11.8*  HCT 42.3 35.1*  MCV 87.4 88.0  PLT 371 301   CBG: Recent Labs  Lab 06/15/20 1630 06/15/20 2046 06/15/20 2348 06/16/20 0459 06/16/20 0826  GLUCAP 138* 139* 116* 111* 79   Hgb A1c No results for input(s): HGBA1C in the last 72 hours. Lipid Profile No results for input(s): CHOL, HDL, LDLCALC, TRIG, CHOLHDL, LDLDIRECT in the last 72 hours. Thyroid function studies No results for input(s): TSH, T4TOTAL, T3FREE, THYROIDAB in the last 72 hours.  Invalid input(s): FREET3 Urinalysis    Component Value Date/Time   COLORURINE RED (A) 08/01/2019 1102   APPEARANCEUR HAZY (A) 08/01/2019 1102   APPEARANCEUR Clear 02/08/2014 1550   LABSPEC 1.010 08/01/2019 1102   LABSPEC 1.019 02/08/2014 1550   PHURINE  08/01/2019 1102    TEST NOT REPORTED DUE TO COLOR INTERFERENCE OF URINE PIGMENT   GLUCOSEU (A) 08/01/2019 1102    TEST NOT REPORTED DUE TO COLOR INTERFERENCE OF URINE PIGMENT   GLUCOSEU Negative 02/08/2014 1550   HGBUR (A) 08/01/2019 1102    TEST NOT REPORTED DUE TO COLOR INTERFERENCE OF URINE PIGMENT   BILIRUBINUR (A) 08/01/2019 1102    TEST NOT REPORTED DUE TO COLOR INTERFERENCE OF  URINE PIGMENT   BILIRUBINUR Negative 02/08/2014 1550   KETONESUR (A) 08/01/2019 1102    TEST NOT REPORTED DUE TO COLOR INTERFERENCE OF URINE PIGMENT   PROTEINUR (A) 08/01/2019 1102    TEST NOT REPORTED DUE TO COLOR INTERFERENCE OF URINE PIGMENT   NITRITE (A) 08/01/2019 1102    TEST NOT REPORTED DUE TO COLOR INTERFERENCE OF URINE PIGMENT   LEUKOCYTESUR (A) 08/01/2019 1102    TEST NOT REPORTED DUE TO COLOR INTERFERENCE OF URINE PIGMENT   LEUKOCYTESUR Negative 02/08/2014 1550    FURTHER DISCHARGE INSTRUCTIONS:   Get Medicines reviewed and adjusted: Please take all your medications with you for your next visit with your Primary MD   Laboratory/radiological data: Please request your Primary MD to go over all hospital tests and procedure/radiological results at the follow up, please ask your Primary MD to get all Hospital records sent to his/her office.   In some cases, they will be blood work, cultures and biopsy results pending at the time of your discharge. Please  request that your primary care M.D. goes through all the records of your hospital data and follows up on these results.   Also Note the following: If you experience worsening of your admission symptoms, develop shortness of breath, life threatening emergency, suicidal or homicidal thoughts you must seek medical attention immediately by calling 911 or calling your MD immediately  if symptoms less severe.   You must read complete instructions/literature along with all the possible adverse reactions/side effects for all the Medicines you take and that have been prescribed to you. Take any new Medicines after you have completely understood and accpet all the possible adverse reactions/side effects.    Do not drive when taking Pain medications or sleeping medications (Benzodaizepines)   Do not take more than prescribed Pain, Sleep and Anxiety Medications. It is not advisable to combine anxiety,sleep and pain medications without  talking with your primary care practitioner   Special Instructions: If you have smoked or chewed Tobacco  in the last 2 yrs please stop smoking, stop any regular Alcohol  and or any Recreational drug use.   Wear Seat belts while driving.   Please note: You were cared for by a hospitalist during your hospital stay. Once you are discharged, your primary care physician will handle any further medical issues. Please note that NO REFILLS for any discharge medications will be authorized once you are discharged, as it is imperative that you return to your primary care physician (or establish a relationship with a primary care physician if you do not have one) for your post hospital discharge needs so that they can reassess your need for medications and monitor your lab values.  Time coordinating discharge: 35 minutes  SIGNED:  Pamella Pertostin Vamsi Apfel, MD, PhD 06/16/2020, 9:28 AM

## 2020-06-16 NOTE — Discharge Instructions (Addendum)
Follow with PCP in 5-7 days  PLEASE AVOID ALL NSAIDS (non steroidal anti-inflammatories), ASPIRIN, GOODY'S POWDER and products that contain salicylates.  If you are not sure whether an over the counter medication is an NSAID that contains salicilates or not talk to the pharmacist  Please get a complete blood count and chemistry panel checked by your Primary MD at your next visit, and again as instructed by your Primary MD. Please get your medications reviewed and adjusted by your Primary MD.  Please request your Primary MD to go over all Hospital Tests and Procedure/Radiological results at the follow up, please get all Hospital records sent to your Prim MD by signing hospital release before you go home.  In some cases, there will be blood work, cultures and biopsy results pending at the time of your discharge. Please request that your primary care M.D. goes through all the records of your hospital data and follows up on these results.  If you had Pneumonia of Lung problems at the Hospital: Please get a 2 view Chest X ray done in 6-8 weeks after hospital discharge or sooner if instructed by your Primary MD.  If you have Congestive Heart Failure: Please call your Cardiologist or Primary MD anytime you have any of the following symptoms:  1) 3 pound weight gain in 24 hours or 5 pounds in 1 week  2) shortness of breath, with or without a dry hacking cough  3) swelling in the hands, feet or stomach  4) if you have to sleep on extra pillows at night in order to breathe  Follow cardiac low salt diet and 1.5 lit/day fluid restriction.  If you have diabetes Accuchecks 4 times/day, Once in AM empty stomach and then before each meal. Log in all results and show them to your primary doctor at your next visit. If any glucose reading is under 80 or above 300 call your primary MD immediately.  If you have Seizure/Convulsions/Epilepsy: Please do not drive, operate heavy machinery, participate in  activities at heights or participate in high speed sports until you have seen by Primary MD or a Neurologist and advised to do so again. Per [facility] statutes, patients with seizures are not allowed to drive until they have been seizure-free for six months.  Use caution when using heavy equipment or power tools. Avoid working on ladders or at heights. Take showers instead of baths. Ensure the water temperature is not too high on the home water heater. Do not go swimming alone. Do not lock yourself in a room alone (i.e. bathroom). When caring for infants or small children, sit down when holding, feeding, or changing them to minimize risk of injury to the child in the event you have a seizure. Maintain good sleep hygiene. Avoid alcohol.   If you had Gastrointestinal Bleeding: Please ask your Primary MD to check a complete blood count within one week of discharge or at your next visit. Your endoscopic/colonoscopic biopsies that are pending at the time of discharge, will also need to followed by your Primary MD.  Get Medicines reviewed and adjusted. Please take all your medications with you for your next visit with your Primary MD  Please request your Primary MD to go over all hospital tests and procedure/radiological results at the follow up, please ask your Primary MD to get all Hospital records sent to his/her office.  If you experience worsening of your admission symptoms, develop shortness of breath, life threatening emergency, suicidal or homicidal thoughts you  must seek medical attention immediately by calling 911 or calling your MD immediately  if symptoms less severe.  You must read complete instructions/literature along with all the possible adverse reactions/side effects for all the Medicines you take and that have been prescribed to you. Take any new Medicines after you have completely understood and accpet all the possible adverse reactions/side effects.   Do not drive or operate  heavy machinery when taking Pain medications.   Do not take more than prescribed Pain, Sleep and Anxiety Medications  Special Instructions: If you have smoked or chewed Tobacco  in the last 2 yrs please stop smoking, stop any regular Alcohol  and or any Recreational drug use.  Wear Seat belts while driving.  Please note You were cared for by a hospitalist during your hospital stay. If you have any questions about your discharge medications or the care you received while you were in the hospital after you are discharged, you can call the unit and asked to speak with the hospitalist on call if the hospitalist that took care of you is not available. Once you are discharged, your primary care physician will handle any further medical issues. Please note that NO REFILLS for any discharge medications will be authorized once you are discharged, as it is imperative that you return to your primary care physician (or establish a relationship with a primary care physician if you do not have one) for your aftercare needs so that they can reassess your need for medications and monitor your lab values.  You can reach the hospitalist office at phone [phone] or fax [phone]   If you do not have a primary care physician, you can call [phone] for a physician referral.  Activity: As tolerated with Full fall precautions use walker/cane & assistance as needed    Diet: regular  Disposition Home     Aspirin Overdose Aspirin, also known as acetylsalicylic acid, is a medicine that is commonly used to treat fever, pain, and inflammation. It may also be prescribed for people who have heart disease. It is found in many prescription and over-the-counter medicines, including pain medicines, cold medicines, and medicines for diarrhea or upset stomach. It can also be found in some herbal supplements. An aspirin overdose happens when you take too much aspirin or substances that are like aspirin that contain a  chemical called salicylate. An aspirin overdose, also called salicylate poisoning, can result in serious health problems, including organ damage and even death. What are the causes? This condition is caused by taking too much aspirin or other aspirin-like substances or supplements. It can happen if you take too much at one time (acute overdose) or too much over a long period of time (chronic overdose). Salicylate poisoning is often intentional, but it may also be accidental. What increases the risk? This condition is more likely to develop in:  Infants and older adults. They are at increased risk to develop significant symptoms.  People who do not drink enough fluids (are dehydrated) while taking aspirin.  People who have kidney problems.  People who have lung and heart problems.  People who have had strokes.  People with certain mental health conditions. What are the signs or symptoms? Symptoms of this condition include:  Early symptoms: ? Nausea and vomiting. ? Fever. ? Ringing in the ears or muffled hearing. ? Fatigue.  Later symptoms: ? Anxiety and dizziness. ? Trouble breathing or very rapid breathing. ? Confusion and restlessness. ? Low blood pressure. ? Seizure or  loss of consciousness. How is this diagnosed? This condition can be diagnosed based on your symptoms, history of taking aspirin, and a physical exam. You may also have tests. Tests may check:  The amount of aspirin in your blood (salicylate level).  The level of oxygen in your blood.  Acid levels (pH) of your blood and urine.  Minerals in your blood (electrolytes).  The sugar in your blood (glucose).  Your heart rhythm. You might also have a chest X-ray if you are having shortness of breath or trouble breathing. How is this treated? Treatment for this condition depends on how severe the overdose is, when you last took the aspirin, and how much you took. Treatment may include:  Taking activated  charcoal. The charcoal may be swallowed or may be given through a tube that goes from the nose to the stomach (nasogastric tube). The charcoal will absorb and bind to aspirin in your stomach.  Getting IV fluids and medicines.  Taking medicines to treat seizures if they occur.  Taking an ice bath or using air cooling to bring down fever.  Having a procedure to filter the aspirin from the blood (hemodialysis). This may be needed in very severe cases after other treatment measures have been done. Follow these instructions at home:  Drink enough fluids to keep your urine pale yellow.  Take over-the-counter and prescription medicines only as told by your health care provider.  Keep all follow-up visits as told by your health care provider. This is important.    How is this prevented?  Carefully read the labels of all medicines that you take. These include prescription medicines, over-the-counter medicines, herbal supplements, and vitamins.  Check with your health care provider before you start any new medicine or supplement. This is especially important if you have questions about the instructions.  Manage depression or other mental health conditions before symptoms become too severe. Your health care provider can help with this. Contact a health care provider if:  You continue to have symptoms of an aspirin overdose, including nausea and vomiting, hearing loss, rapid and shallow breathing, confusion, and fever.  You feel weak, dizzy, or light-headed. Get help right away if you:  Have severe chest pain or shortness of breath.  Have nausea and vomiting that does not improve.  Continue to feel confused or confusion gets worse.  Have a seizure. These symptoms may represent a serious problem that is an emergency. Do not wait to see if the symptoms will go away. Get medical help right away. Call your local emergency services (911 in the [place] Do not drive yourself to the hospital. If  you ever feel like you may hurt yourself or others, or have thoughts about taking your own life, get help right away. You can go to your nearest emergency department or call:  Your local emergency services (911 in the [place]  A suicide crisis helpline, such as the National Suicide Prevention Lifeline at 425-710-1987. This is open 24 hours a day. Summary  Aspirin, also known as acetylsalicylic acid, is a medicine that is commonly used to treat fever, pain, and inflammation.  An aspirin overdose happens when you take too much aspirin or substances that are like aspirin that contain a chemical called salicylate. An aspirin overdose is also called salicylate poisoning.  Symptoms of aspirin overdose include nausea, vomiting, hearing problems, dizziness, fever, trouble breathing, confusion, and seizure.  Treatment may include fluids, activated charcoal, medicines, cooling methods, and having a procedure to filter the  aspirin from the blood (hemodialysis). This information is not intended to replace advice given to you by your health care provider. Make sure you discuss any questions you have with your health care provider. Document Revised: 09/29/2018 Document Reviewed: 09/29/2018 Elsevier Patient Education  2021 [employer].

## 2020-06-16 NOTE — Progress Notes (Signed)
Vitals WDL at time of discharge. Patient education provided and discharge instructions reviewed. All questions answered at this time for patient. Packet left with patient.

## 2020-06-20 LAB — BLOOD GAS, VENOUS
Acid-base deficit: 9.4 mmol/L — ABNORMAL HIGH (ref 0.0–2.0)
Bicarbonate: 12.9 mmol/L — ABNORMAL LOW (ref 20.0–28.0)
O2 Saturation: 91.6 %
Patient temperature: 37
pCO2, Ven: 19 mmHg — CL (ref 44.0–60.0)
pH, Ven: 7.44 — ABNORMAL HIGH (ref 7.250–7.430)
pO2, Ven: 60 mmHg — ABNORMAL HIGH (ref 32.0–45.0)

## 2020-07-29 DIAGNOSIS — F3176 Bipolar disorder, in full remission, most recent episode depressed: Secondary | ICD-10-CM | POA: Insufficient documentation

## 2020-07-29 DIAGNOSIS — G4700 Insomnia, unspecified: Secondary | ICD-10-CM | POA: Insufficient documentation

## 2020-07-29 DIAGNOSIS — Z309 Encounter for contraceptive management, unspecified: Secondary | ICD-10-CM | POA: Insufficient documentation

## 2020-07-29 DIAGNOSIS — F514 Sleep terrors [night terrors]: Secondary | ICD-10-CM | POA: Insufficient documentation

## 2020-07-29 DIAGNOSIS — M545 Low back pain, unspecified: Secondary | ICD-10-CM | POA: Insufficient documentation

## 2020-12-09 ENCOUNTER — Encounter: Payer: Self-pay | Admitting: Emergency Medicine

## 2020-12-09 ENCOUNTER — Emergency Department
Admission: EM | Admit: 2020-12-09 | Discharge: 2020-12-09 | Disposition: A | Discharge status: Home / Self Care | Payer: Medicaid Other | Attending: Emergency Medicine | Admitting: Emergency Medicine

## 2020-12-09 ENCOUNTER — Other Ambulatory Visit: Payer: Self-pay

## 2020-12-09 DIAGNOSIS — K047 Periapical abscess without sinus: Secondary | ICD-10-CM | POA: Diagnosis not present

## 2020-12-09 DIAGNOSIS — Z91040 Latex allergy status: Secondary | ICD-10-CM | POA: Diagnosis not present

## 2020-12-09 DIAGNOSIS — F17210 Nicotine dependence, cigarettes, uncomplicated: Secondary | ICD-10-CM | POA: Diagnosis not present

## 2020-12-09 DIAGNOSIS — Z79899 Other long term (current) drug therapy: Secondary | ICD-10-CM | POA: Diagnosis not present

## 2020-12-09 DIAGNOSIS — J45909 Unspecified asthma, uncomplicated: Secondary | ICD-10-CM | POA: Diagnosis not present

## 2020-12-09 DIAGNOSIS — R220 Localized swelling, mass and lump, head: Secondary | ICD-10-CM | POA: Diagnosis present

## 2020-12-09 LAB — COMPREHENSIVE METABOLIC PANEL
ALT: 18 U/L (ref 0–44)
AST: 17 U/L (ref 15–41)
Albumin: 3.7 g/dL (ref 3.5–5.0)
Alkaline Phosphatase: 66 U/L (ref 38–126)
Anion gap: 11 (ref 5–15)
BUN: 9 mg/dL (ref 6–20)
CO2: 20 mmol/L — ABNORMAL LOW (ref 22–32)
Calcium: 8.5 mg/dL — ABNORMAL LOW (ref 8.9–10.3)
Chloride: 108 mmol/L (ref 98–111)
Creatinine, Ser: 0.51 mg/dL (ref 0.44–1.00)
GFR, Estimated: >60 mL/min (ref >60)
Glucose, Bld: 96 mg/dL (ref 70–99)
Potassium: 3.7 mmol/L (ref 3.5–5.1)
Sodium: 139 mmol/L (ref 135–145)
Total Bilirubin: 0.6 mg/dL (ref 0.3–1.2)
Total Protein: 6.7 g/dL (ref 6.5–8.1)

## 2020-12-09 LAB — CBC WITH DIFFERENTIAL/PLATELET
Abs Immature Granulocytes: 0.01 10*3/uL (ref 0.00–0.07)
Basophils Absolute: 0 10*3/uL (ref 0.0–0.1)
Basophils Relative: 1 %
Eosinophils Absolute: 0.2 10*3/uL (ref 0.0–0.5)
Eosinophils Relative: 3 %
HCT: 39.3 % (ref 36.0–46.0)
Hemoglobin: 14 g/dL (ref 12.0–15.0)
Immature Granulocytes: 0 %
Lymphocytes Relative: 28 %
Lymphs Abs: 1.7 10*3/uL (ref 0.7–4.0)
MCH: 30.6 pg (ref 26.0–34.0)
MCHC: 35.6 g/dL (ref 30.0–36.0)
MCV: 86 fL (ref 80.0–100.0)
Monocytes Absolute: 0.7 10*3/uL (ref 0.1–1.0)
Monocytes Relative: 11 %
Neutro Abs: 3.6 10*3/uL (ref 1.7–7.7)
Neutrophils Relative %: 57 %
Platelets: 272 10*3/uL (ref 150–400)
RBC: 4.57 MIL/uL (ref 3.87–5.11)
RDW: 12.9 % (ref 11.5–15.5)
WBC: 6.2 10*3/uL (ref 4.0–10.5)
nRBC: 0 % (ref 0.0–0.2)

## 2020-12-09 LAB — LACTIC ACID, PLASMA: Lactic Acid, Venous: 0.6 mmol/L (ref 0.5–1.9)

## 2020-12-09 MED ORDER — KETOROLAC TROMETHAMINE 30 MG/ML IJ SOLN
30.0000 mg | Freq: Once | INTRAMUSCULAR | Status: AC
  Administered 2020-12-09: 30 mg via INTRAVENOUS
  Filled 2020-12-09: qty 1

## 2020-12-09 MED ORDER — SODIUM CHLORIDE 0.9 % IV SOLN
1.0000 g | Freq: Once | INTRAVENOUS | Status: AC
  Administered 2020-12-09: 1 g via INTRAVENOUS
  Filled 2020-12-09: qty 10

## 2020-12-09 MED ORDER — CEPHALEXIN 500 MG PO CAPS: 500.0000 mg | ORAL_CAPSULE | Freq: Four times a day (QID) | ORAL | 0 refills | Status: AC

## 2020-12-09 MED ORDER — IBUPROFEN 800 MG PO TABS: 800.0000 mg | ORAL_TABLET | Freq: Three times a day (TID) | ORAL | 0 refills | Status: AC | PRN

## 2020-12-09 MED ORDER — SODIUM CHLORIDE 0.9 % IV BOLUS
1000.0000 mL | Freq: Once | INTRAVENOUS | Status: AC
  Administered 2020-12-09: 1000 mL via INTRAVENOUS

## 2020-12-09 MED ORDER — HYDROCODONE-ACETAMINOPHEN 5-325 MG PO TABS: 1.0000 | ORAL_TABLET | Freq: Four times a day (QID) | ORAL | 0 refills | Status: AC | PRN

## 2020-12-09 NOTE — ED Provider Notes (Signed)
[facility] Emergency Department Provider Note  ____________________________________________   Event Date/Time   First MD Initiated Contact with Patient 12/09/20 [phone]     (approximate)  I have reviewed the triage vital signs and the nursing notes.   HISTORY  Chief Complaint Facial Swelling    HPI Gwendolyn Gonzales is a 35 y.o. female presents emergency department complaining of swelling to the right lower jaw gumline.  Had her wisdom tooth pulled 1 to 2 months ago.  Thinks the other molar has a cavity in it.  States she took ibuprofen without any relief.  No fever or chills.  States area is very painful and is radiating into the lips and up the side of her head.  No rash.  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 Authorizing Provider  cephALEXin (KEFLEX) 500 MG capsule Take 1 capsule (500 mg total) by mouth 4 (four) times daily for 10 days. 12/09/20 12/19/20 Yes Addy Mcmannis, Roselyn Bering, PA-C  HYDROcodone-acetaminophen (NORCO/VICODIN) 5-325  MG tablet Take 1 tablet by mouth every 6 (six) hours as needed for moderate pain. 12/09/20  Yes Bralon Antkowiak, Roselyn Bering, PA-C  ibuprofen (ADVIL) 800 MG tablet Take 1 tablet (800 mg total) by mouth every 8 (eight) hours as needed. 12/09/20  Yes Delma Villalva, Roselyn Bering, PA-C  acetaminophen (TYLENOL) 325 MG tablet Take 2 tablets (650 mg total) by mouth every 6 (six) hours as needed for mild pain (or Fever >/= 101). 06/13/17   Auburn Bilberry, MD  albuterol (VENTOLIN HFA) 108 (90 Base) MCG/ACT inhaler Inhale 2 puffs into the lungs every 6 (six) hours as needed for wheezing or shortness of breath. 06/16/20   Leatha Gilding, MD  ARIPiprazole (ABILIFY) 30 MG tablet Take 30 mg by mouth daily.    [provider]  cyclobenzaprine (FLEXERIL) 10 MG tablet Take 0.5 tablets (5 mg total) by mouth 3 (three) times daily as needed for muscle spasms. 06/16/20   Leatha Gilding, MD  levETIRAcetam (KEPPRA) 500 MG tablet Take 1 tablet (500 mg total) by mouth in the morning, at noon, and at bedtime. 06/16/20   Leatha Gilding, MD  medroxyPROGESTERone (DEPO-PROVERA) 150 MG/ML injection Inject 150 mg into the muscle every 3 (three) months. Patient not taking: Reported on 09/19/2019    [provider]  OLANZapine zydis (ZYPREXA) 5 MG disintegrating tablet Take 1 tablet (5 mg total) by mouth at bedtime. Patient not taking: Reported on 06/11/2017 06/01/16  Jimmy Footman, MD  pantoprazole (PROTONIX) 40 MG tablet Take 1 tablet (40 mg total) by mouth daily. 06/13/17 01/24/20  Auburn Bilberry, MD    Allergies Augmentin [amoxicillin-pot clavulanate], Lactose intolerance (gi), Biaxin [clarithromycin], and Latex  Family History  Problem Relation Age of Onset   Throat cancer Father    Lung cancer Father    Breast cancer Paternal Aunt    Ovarian cancer Paternal Aunt    Throat cancer Paternal Aunt    Lung cancer Paternal Aunt    Hypertension Mother    Anxiety disorder Mother    High Cholesterol Mother    Hypertension  Maternal Grandmother    High Cholesterol Maternal Grandmother    Leukemia Maternal Grandmother    Diabetes Maternal Grandmother    Hypertension Maternal Grandfather    High Cholesterol Maternal Grandfather     Social History Social History   Tobacco Use   Smoking status: Every Day    Packs/day: 0.25    Years: 21.00    Pack years: 5.25    Types: Cigarettes   Smokeless tobacco: Former  Building services engineer Use: Former  Substance Use Topics   Alcohol use: Not Currently    Comment: Last ETOH use "couple of years ago"   Drug use: Not Currently    Types: Methamphetamines, Marijuana    Comment: States clean x 1 - 2 years    Review of Systems  Constitutional: No fever/chills Eyes: No visual changes. ENT: No sore throat. Respiratory: Denies cough Cardiovascular: Denies chest pain Gastrointestinal: Denies abdominal pain Genitourinary: Negative for dysuria. Musculoskeletal: Negative for back pain. Skin: Negative for rash. Psychiatric: no mood changes,     ____________________________________________   PHYSICAL EXAM:  VITAL SIGNS: ED Triage Vitals [12/09/20 0823]  Enc Vitals Group     BP (!) 120/93     Pulse Rate (!) 129     Resp 20     Temp 98.2 F (36.8 C)     Temp Source Oral     SpO2 95 %     Weight 179 lb (81.2 kg)     Height 5\' 2"  (1.575 m)     Head Circumference      Peak Flow      Pain Score 10     Pain Loc      Pain Edu?      Excl. in GC?     Constitutional: Alert and oriented. Well appearing and in no acute distress. Eyes: Conjunctivae are normal.  Head: Atraumatic.  Positive for right sided facial swelling with some redness associated. Nose: No congestion/rhinnorhea. Mouth/Throat: Mucous membranes are moist.  Gumline is swollen, no palpable abscesses noted Neck:  supple no lymphadenopathy noted Cardiovascular: Normal rate, regular rhythm.  Respiratory: Normal respiratory effort.  No retractions,  GU: deferred Musculoskeletal: FROM all  extremities, warm and well perfused Neurologic:  Normal speech and language.  Skin:  Skin is warm, dry and intact. No rash noted. Psychiatric: Mood and affect are normal. Speech and behavior are normal.  ____________________________________________   LABS (all labs ordered are listed, but only abnormal results are displayed)  Labs Reviewed  COMPREHENSIVE METABOLIC PANEL - Abnormal; Notable for the following components:      Result Value   CO2 20 (*)    Calcium 8.5 (*)    All other components within normal limits  LACTIC ACID, PLASMA  CBC WITH DIFFERENTIAL/PLATELET  LACTIC ACID, PLASMA  URINALYSIS, COMPLETE (UACMP) WITH MICROSCOPIC  POC URINE PREG, ED  ____________________________________________   ____________________________________________  RADIOLOGY    ____________________________________________   PROCEDURES  Procedure(s) performed: No  Procedures    ____________________________________________   INITIAL IMPRESSION / ASSESSMENT AND PLAN / ED COURSE  Pertinent labs & imaging results that were available during my care of the patient were reviewed by me and considered in my medical decision making (see chart for details).   The patient is a 35 year old female presents emergency department with a dental abscess.  See HPI.  Physical exam shows patient to appear stable.  DDx: Dental abscess, cellulitis, blocked salivary gland  Labs are reassuring, CBC, metabolic panel, lactic acid are all negative.  Patient is a little tachycardic so we will give her fluids along with antibiotics and pain medication.  Do feel this is a dental abscess and not any of the other differentials.  We will give her a prescription for Keflex that she states she can take this without any difficulty.  We will also given anti-inflammatory and pain medication.     Gwendolyn Gonzales was evaluated in Emergency Department on 12/09/2020 for the symptoms described in the history of  present illness. She was evaluated in the context of the global COVID-19 pandemic, which necessitated consideration that the patient might be at risk for infection with the SARS-CoV-2 virus that causes COVID-19. Institutional protocols and algorithms that pertain to the evaluation of patients at risk for COVID-19 are in a state of rapid change based on information released by regulatory bodies including the CDC and federal and state organizations. These policies and algorithms were followed during the patient's care in the ED.    As part of my medical decision making, I reviewed the following data within the electronic MEDICAL RECORD NUMBER Nursing notes reviewed and incorporated, Labs reviewed , Old chart reviewed, Notes from prior ED visits, and [place] Controlled Substance Database  ____________________________________________   FINAL CLINICAL IMPRESSION(S) / ED DIAGNOSES  Final diagnoses:  Dental abscess      NEW MEDICATIONS STARTED DURING THIS VISIT:  New Prescriptions   CEPHALEXIN (KEFLEX) 500 MG CAPSULE    Take 1 capsule (500 mg total) by mouth 4 (four) times daily for 10 days.   HYDROCODONE-ACETAMINOPHEN (NORCO/VICODIN) 5-325 MG TABLET    Take 1 tablet by mouth every 6 (six) hours as needed for moderate pain.   IBUPROFEN (ADVIL) 800 MG TABLET    Take 1 tablet (800 mg total) by mouth every 8 (eight) hours as needed.     Note:  This document was prepared using Dragon voice recognition software and may include unintentional dictation errors.    Faythe Ghee, PA-C 12/09/20 [id number]    Dionne Bucy, MD 12/09/20 1517

## 2020-12-09 NOTE — ED Triage Notes (Signed)
Pt reports that she has an infection in her lower right tooth. She has been taking Ibuprofen and it is not helping. She has tried putting heat on it also. She states that it draining and she can taste it.

## 2020-12-09 NOTE — Discharge Instructions (Signed)
Follow-up with your regular doctor if not improving in 2 to 3 days. Return emergency department if worsening. Take your medications as prescribed. 

## 2020-12-09 NOTE — ED Notes (Signed)
See triage note  presents with pain and swelling to right lower gumline   states she had her wisdom tooth pulled about 1-2 months ago thinks that the tooth next to area has a hole in it  right side of face is swollen  redness noted

## 2021-01-02 IMAGING — US US PELVIS COMPLETE
1 series · 14 of 25 positions shown · non-contrast
Comparison: Pregnancy ultrasound dated 02/09/2017

CLINICAL DATA: 33-year-old female with vaginal bleeding and
cramping. Negative urine pregnancy test.

EXAM:
TRANSABDOMINAL AND TRANSVAGINAL ULTRASOUND OF PELVIS
TECHNIQUE: Both transabdominal and transvaginal ultrasound examinations of the
pelvis were performed. Transabdominal technique was performed for
global imaging of the pelvis including uterus, ovaries, adnexal
regions, and pelvic cul-de-sac. It was necessary to proceed with
endovaginal exam following the transabdominal exam to visualize the
endometrium and ovaries.

[Series 1: us pelvis complete · 0.20mm/px · 14 of 84 slices shown]
[im 1/84]
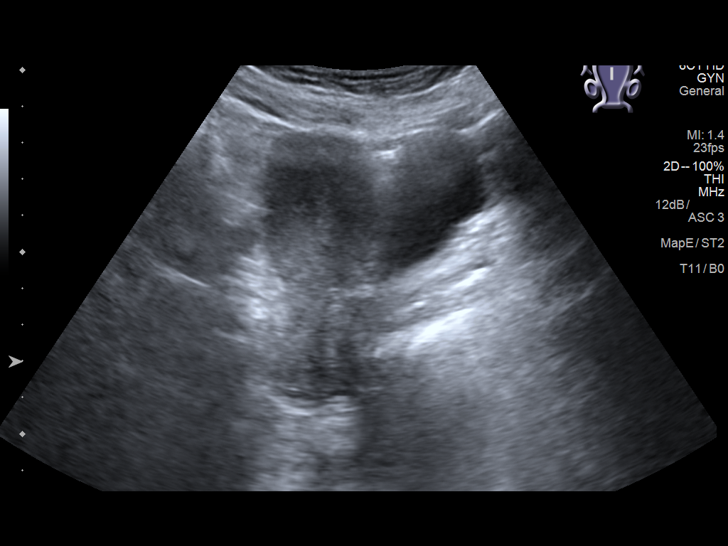
[im 7/84]
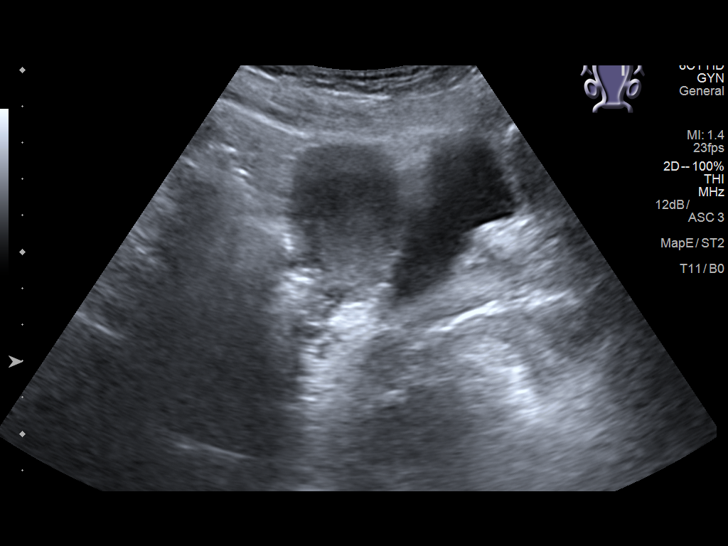
[im 14/84]
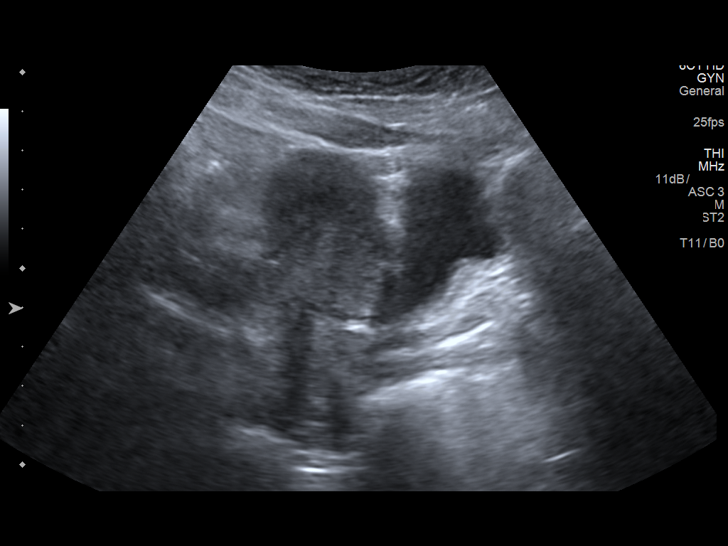
[im 21/84]
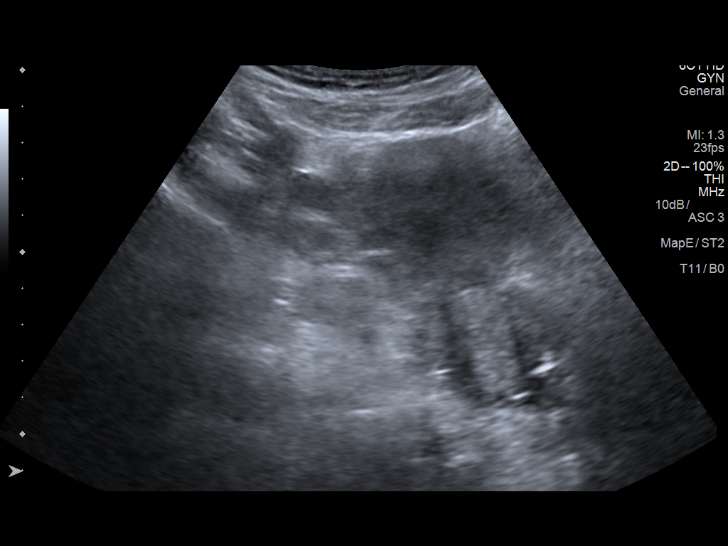
[im 28/84]
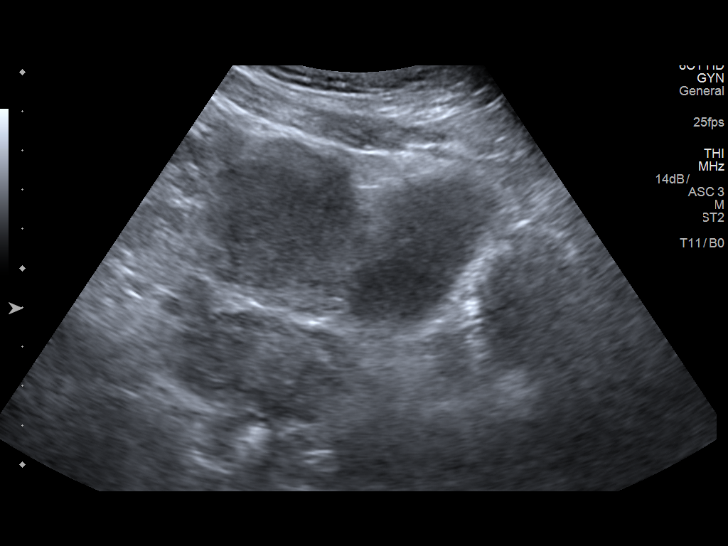
[im 32/84]
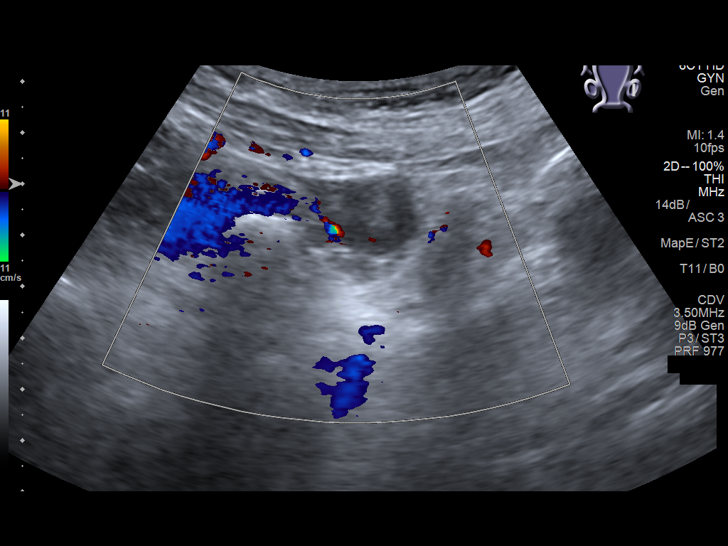
[im 39/84]
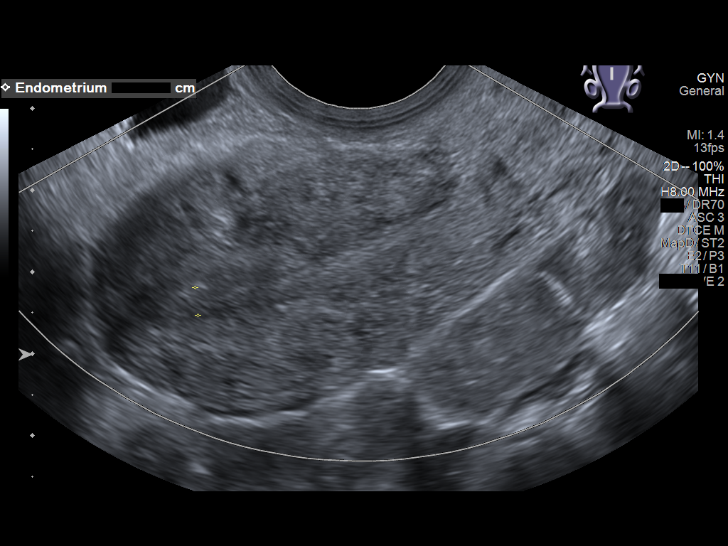
[im 45/84]
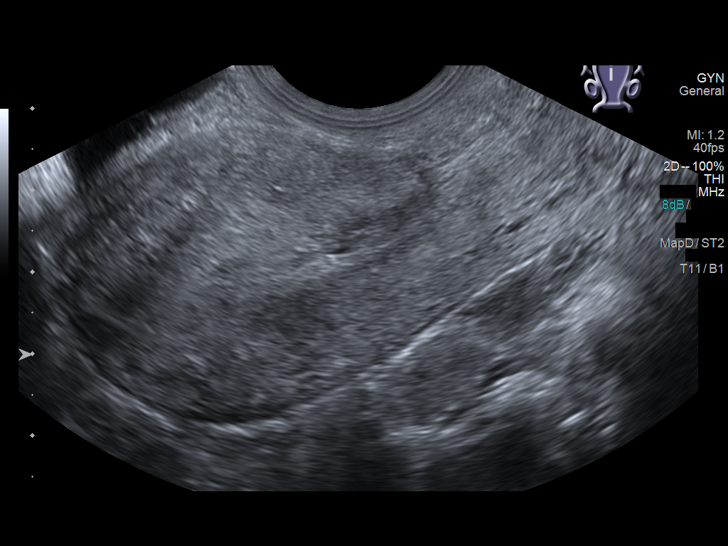
[im 52/84]
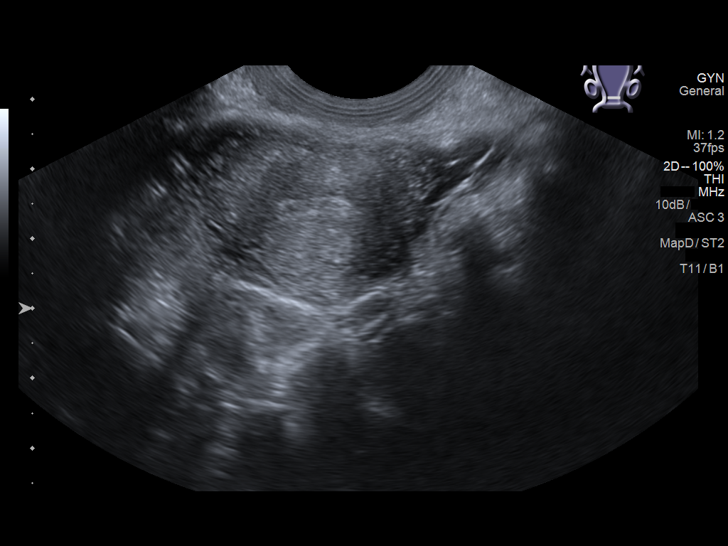
[im 56/84]
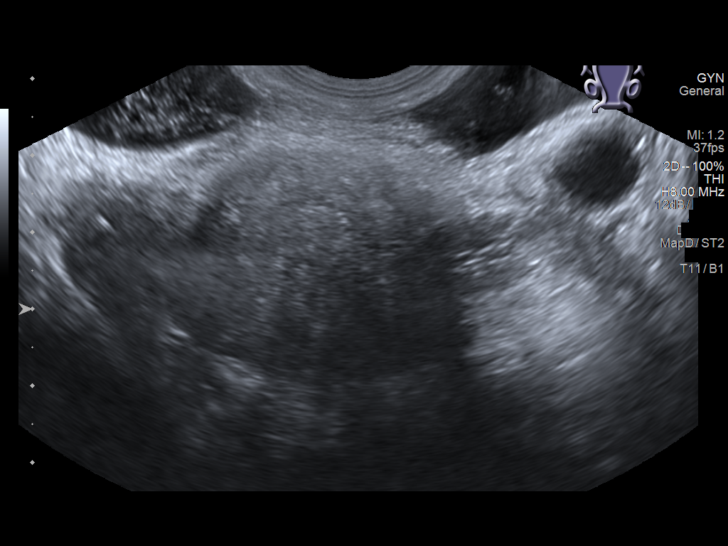
[im 63/84]
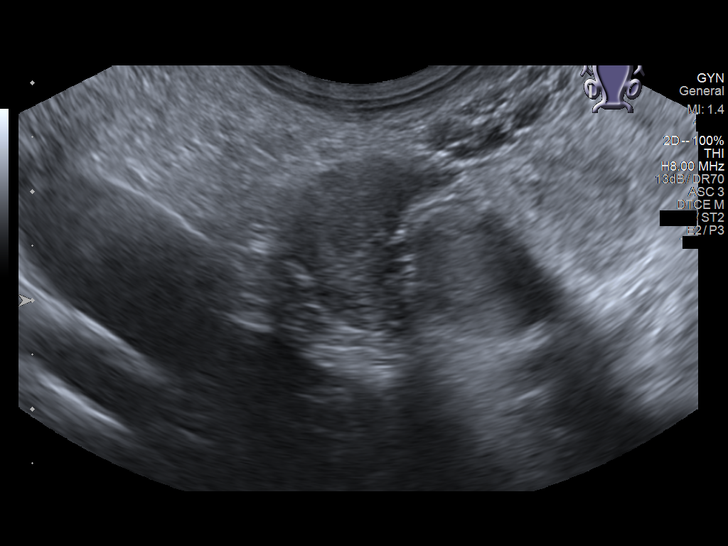
[im 70/84]
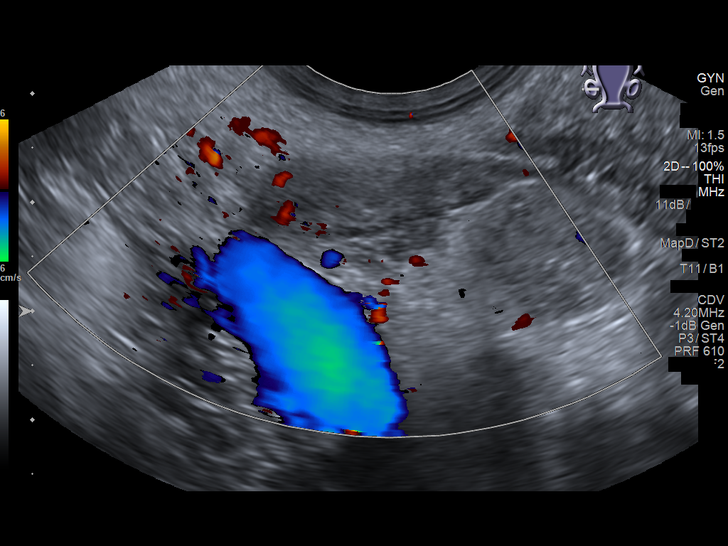
[im 77/84]
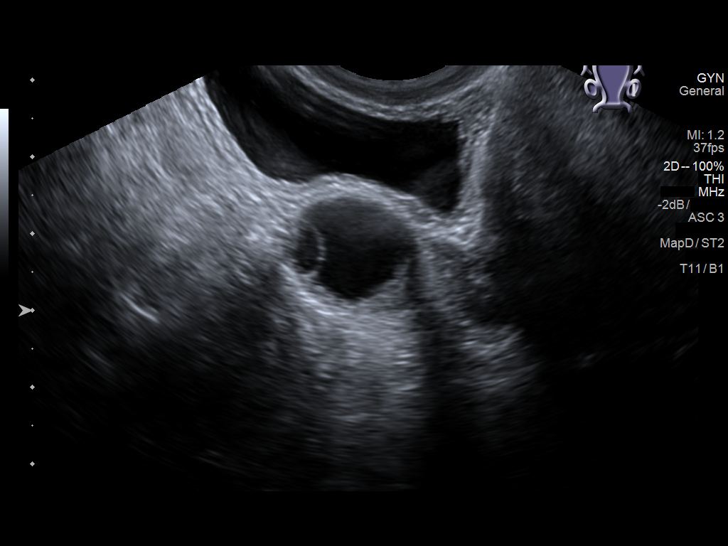
[im 84/84]
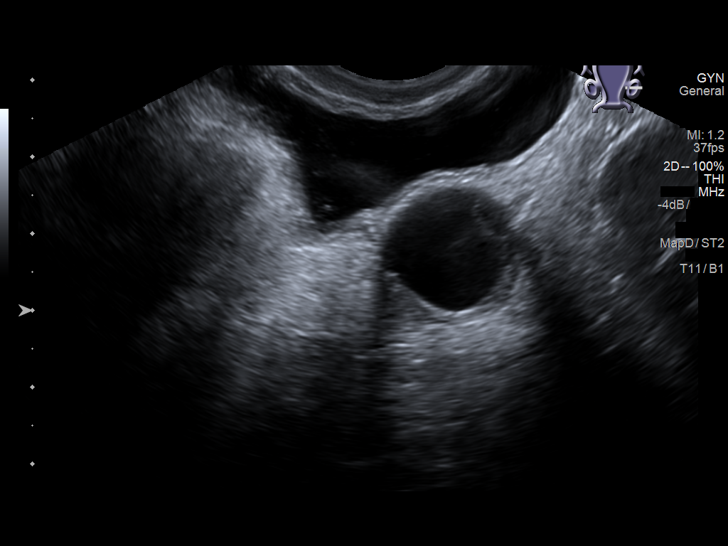

[14 of 25 positions shown; findings below may reference images not displayed]

FINDINGS: Uterus

Measurements: 7.1 x 3.2 x 4.3 cm = volume: 51 mL. The uterus is
anteverted. There is slight heterogeneity of the myometrium. No
discrete mass.

Endometrium

Thickness: 3 mm.  No focal abnormality visualized.

Right ovary

Measurements: 1.9 x 1.1 x 2.2 cm = volume: 2.5 mL. Normal
appearance/no adnexal mass.

Left ovary

Measurements: 3.0 x 1.6 x 2.9 cm = volume: 7.2 mL. The left ovary is
unremarkable. There is a 1.7 cm prominent follicle/cyst.

Other findings

No abnormal free fluid.
IMPRESSION: Unremarkable pelvic ultrasound.

## 2021-07-11 ENCOUNTER — Ambulatory Visit: Payer: Medicaid Other

## 2022-12-03 ENCOUNTER — Ambulatory Visit: Payer: MEDICAID

## 2023-03-24 ENCOUNTER — Ambulatory Visit: Payer: MEDICAID | Admitting: Psychiatry

## 2023-04-19 ENCOUNTER — Ambulatory Visit: Payer: MEDICAID | Admitting: Psychiatry

## 2023-04-19 ENCOUNTER — Encounter: Payer: Self-pay | Admitting: Psychiatry

## 2023-04-19 ENCOUNTER — Ambulatory Visit (INDEPENDENT_AMBULATORY_CARE_PROVIDER_SITE_OTHER): Payer: MEDICAID | Admitting: Psychiatry

## 2023-04-19 VITALS — BP 117/77 | HR 91 | Temp 98.1°F | Ht 59.5 in | Wt 158.0 lb

## 2023-04-19 DIAGNOSIS — F1491 Cocaine use, unspecified, in remission: Secondary | ICD-10-CM | POA: Insufficient documentation

## 2023-04-19 DIAGNOSIS — G4700 Insomnia, unspecified: Secondary | ICD-10-CM

## 2023-04-19 DIAGNOSIS — Z8659 Personal history of other mental and behavioral disorders: Secondary | ICD-10-CM

## 2023-04-19 DIAGNOSIS — F1511 Other stimulant abuse, in remission: Secondary | ICD-10-CM | POA: Diagnosis not present

## 2023-04-19 DIAGNOSIS — F1411 Cocaine abuse, in remission: Secondary | ICD-10-CM | POA: Insufficient documentation

## 2023-04-19 DIAGNOSIS — Z87898 Personal history of other specified conditions: Secondary | ICD-10-CM | POA: Insufficient documentation

## 2023-04-19 DIAGNOSIS — Z79899 Other long term (current) drug therapy: Secondary | ICD-10-CM | POA: Insufficient documentation

## 2023-04-19 DIAGNOSIS — F411 Generalized anxiety disorder: Secondary | ICD-10-CM

## 2023-04-19 MED ORDER — HYDROXYZINE HCL 10 MG PO TABS: 10.0000 mg | ORAL_TABLET | Freq: Two times a day (BID) | ORAL | 1 refills | Status: DC | PRN

## 2023-04-19 MED ORDER — MIRTAZAPINE 7.5 MG PO TABS: 7.5000 mg | ORAL_TABLET | Freq: Every day | ORAL | 1 refills | Status: DC

## 2023-04-19 NOTE — Progress Notes (Unsigned)
Psychiatric Initial Adult Assessment   Patient Identification: Gwendolyn Gonzales MRN:  [id number] Date of Evaluation:  04/19/2023 Referral Source: Truddie Hidden MD Chief Complaint:   Chief Complaint  Patient presents with   Establish Care   Anxiety   Insomnia   Visit Diagnosis:    ICD-10-CM   1. GAD (generalized anxiety disorder)  F41.1 mirtazapine (REMERON) 7.5 MG tablet    hydrOXYzine (ATARAX) 10 MG tablet    2. Insomnia, unspecified type  G47.00 mirtazapine (REMERON) 7.5 MG tablet    3. 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 History of alcohol use  Z87.898     5. History of cocaine use  F14.91     6. High risk medication use  Z79.899 TSH    Urine drugs of abuse scrn w alc, routine (Ref Lab)      History of Present Illness:  Gwendolyn Gonzales is a 38 year old Caucasian female, single, lives in [place], has a history of anxiety, polysubstance abuse, seizure disorder, obstructive sleep apnea, was evaluated in office today, presented to establish care.  She was referred by Dr. Tacy Dura from neurology for psychiatric evaluation.  She experiences significant mood swings, irritability, and feelings of being 'drained of energy.' Her mood is described as 'up and down.  She struggles with a lot of anxiety, constant worry and feels aggravated often.  Anxiety is related to current life situation including a pending disability application, inability to work.  Denies any  current suicidal thoughts or past suicide attempts.  Denies manic episodes, hypersexuality, or reckless behaviors.  Does not believe she is actually depressed however there are days when she feels down.  She experiences sleep disturbances, including difficulty falling asleep and staying asleep, attributed to stress and anxiety. Vivid dreams keep her awake. Over-the-counter sleep aids like Z-Quil and a generic sleep aid from [employer] have been ineffective.  She denies any  obsessions or compulsive behaviors.  She does report a history of polysubstance abuse.  Patient reports use of stimulants, alcohol, cannabis and cocaine in the past.  More details in the substance abuse history.  She has a history of seizures, currently under the care of neurology.  Currently taking Keppra 1500 mg twice daily for seizure management.    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  I have reviewed notes per Dr. Ardyth Harps.  Patient also with another admission to [facility] behavioral health unit-04/19/2012.  Previous Psychotropic Medications: Yes past trials of medications multiple including Abilify, olanzapine, patient unable to give more details.  Substance Abuse History in the last 12 months:  No.  Patient currently denies any substance use however as noted about as have a history of polysubstance abuse-methamphetamine abuse-started at the age of 29 may have used it on and off unable to give more details. Alcohol use-started at the age of 58, used it until 2016 per report. Cocaine use-started as a senior in high school on and off, has not used in the past few years. Cannabis use-used it on and off, last use was in 2024.  Consequences of Substance Abuse: Negative  Past Medical History:  Past Medical History:  Diagnosis Date   Anxiety    Depression  History of abnormal cervical Pap smear    "years ago"   History of asthma    as child   History of chlamydia    History of urinary tract infection    Seizure (HCC)    07/2019    Past Surgical History:  Procedure Laterality Date   CESAREAN SECTION      CESAREAN SECTION  15 May 2006   FLEXIBLE BRONCHOSCOPY W/ UPPER ENDOSCOPY     INNER EAR SURGERY      Family Psychiatric History: As noted below.  Family History:  Family History  Problem Relation Age of Onset   Throat cancer Father    Lung cancer Father    Breast cancer Paternal Aunt    Ovarian cancer Paternal Aunt    Throat cancer Paternal Aunt    Lung cancer Paternal Aunt    Hypertension Mother    Anxiety disorder Mother    High Cholesterol Mother    Hypertension Maternal Grandmother    High Cholesterol Maternal Grandmother    Leukemia Maternal Grandmother    Diabetes Maternal Grandmother    Hypertension Maternal Grandfather    High Cholesterol Maternal Grandfather     Social History:   Social History   Socioeconomic History   Marital status: Single    Spouse name: Not on file   Number of children: 2   Years of education: Not on file   Highest education level: High school graduate  Occupational History   Not on file  Tobacco Use   Smoking status: Every Day    Current packs/day: 0.25    Average packs/day: 0.3 packs/day for 21.0 years (5.3 ttl pk-yrs)    Types: Cigarettes, E-cigarettes   Smokeless tobacco: Former   Tobacco comments:    Quit smoking cigarettes and now vaps   Vaping Use   Vaping status: Some Days   Substances: Nicotine  Substance and Sexual Activity   Alcohol use: Not Currently   Drug use: Not Currently    Types: Methamphetamines, Marijuana    Comment: currently denies   Sexual activity: Not Currently    Birth control/protection: None  Other Topics Concern   Not on file  Social History Narrative   Not on file   Social Drivers of Health   Financial Resource Strain: High Risk (03/20/2022)   Received from [facility]   Overall Financial Resource Strain (CARDIA)    Difficulty of Paying Living Expenses: Hard  Food Insecurity: No Food Insecurity (03/20/2022)   [facility]   Hunger Vital Sign    Worried About Running Out  of Food in the Last Year: Never true    Ran Out of Food in the Last Year: Never true  Transportation Needs: No Transportation Needs (03/20/2022)   Received from [facility]   PRAPARE - Transportation    Lack of Transportation (Medical): No    Lack of Transportation (Non-Medical): No  Physical Activity: Not on file  Stress: Not on file  Social Connections: Not on file    Additional Social History: Patient was raised in [place] by mother and [place] parents.  She had a normal childhood.  Reports she has 2 siblings.  She graduated high school.  She is currently single.  She has 2 children 63 year old son and 28-year-old daughter.  Reports that fathers are not involved.  Her 81 year old currently lives with her mother.  She lives with a friend in [place] and her 33-year-old lives with her.  Patient reports she is  currently unemployed.  She used to be in the [employer] in the past for at least 3 years and was discharged, unknown if it was honorable.  Reports she was discharged due to seizures.  Patient is currently trying to get disability.  She has food stamps.  She does not work.  She does report a history of legal issues although they are dismissed she believes she is still under investigation.  Allergies:   Allergies  Allergen Reactions   Augmentin [Amoxicillin-Pot Clavulanate] Hives   Lactose Intolerance (Gi) Nausea And Vomiting   Biaxin [Clarithromycin] Rash   Latex Rash    Metabolic Disorder Labs: Lab Results  Component Value Date   HGBA1C 5.0 05/29/2016   MPG 97 05/29/2016   Lab Results  Component Value Date   PROLACTIN 34.9 (H) 05/29/2016   Lab Results  Component Value Date   CHOL 192 05/29/2016   TRIG 78 05/29/2016   HDL 50 05/29/2016   CHOLHDL 3.8 05/29/2016   VLDL 16 05/29/2016   LDLCALC 126 (H) 05/29/2016   Lab Results  Component Value Date   TSH 3.280 05/29/2016    Therapeutic Level Labs: No results found for: "LITHIUM" No results found for: "CBMZ" No results  found for: "VALPROATE"  Current Medications: Current Outpatient Medications  Medication Sig Dispense Refill   acetaminophen (TYLENOL) 325 MG tablet Take 2 tablets (650 mg total) by mouth every 6 (six) hours as needed for mild pain (or Fever >/= 101).     albuterol (VENTOLIN HFA) 108 (90 Base) MCG/ACT inhaler Inhale 2 puffs into the lungs every 6 (six) hours as needed for wheezing or shortness of breath. 8 g 2   Docusate Sodium (DSS) 100 MG CAPS Take 1 capsule by mouth daily.     HYDROcodone-acetaminophen (NORCO/VICODIN) 5-325 MG tablet Take 1 tablet by mouth every 6 (six) hours as needed for moderate pain. 8 tablet 0   hydrOXYzine (ATARAX) 10 MG tablet Take 1 tablet (10 mg total) by mouth 2 (two) times daily as needed for anxiety. 60 tablet 1   ibuprofen (ADVIL) 800 MG tablet Take 1 tablet (800 mg total) by mouth every 8 (eight) hours as needed. 30 tablet 0   levETIRAcetam (KEPPRA) 500 MG tablet Take 1 tablet (500 mg total) by mouth in the morning, at noon, and at bedtime.     loratadine (CLARITIN) 10 MG tablet Take 1 tablet by mouth daily.     mirtazapine (REMERON) 7.5 MG tablet Take 1 tablet (7.5 mg total) by mouth at bedtime. 30 tablet 1   pantoprazole (PROTONIX) 40 MG tablet Take 1 tablet (40 mg total) by mouth daily. 30 tablet 1   medroxyPROGESTERone (DEPO-PROVERA) 150 MG/ML injection Inject 150 mg into the muscle every 3 (three) months. (Patient not taking: Reported on 09/19/2019)     Multiple Vitamin (MULTI-VITAMIN) tablet Take 1 tablet by mouth daily.     No current facility-administered medications for this visit.    Musculoskeletal: Strength & Muscle Tone: within normal limits Gait & Station: normal Patient leans: N/A  Psychiatric Specialty Exam: Review of Systems  Neurological:  Positive for seizures (per history).  Psychiatric/Behavioral:  Positive for sleep disturbance. The patient is nervous/anxious.     Blood pressure 117/77, pulse 91, temperature 98.1 F (36.7 C), height  4' 11.5" (1.511 m), weight 158 lb (71.7 kg), last menstrual period 04/16/2023, not currently breastfeeding.Body mass index is 31.38 kg/m.  General Appearance: Well Groomed  Eye Contact:  Good  Speech:  Clear and Coherent  Volume:  Normal  Mood:  Anxious  Affect:  Congruent  Thought Process:  Goal Directed and Descriptions of Associations: Intact  Orientation:  Full (Time, Place, and Person)  Thought Content:  Logical  Suicidal Thoughts:  No  Homicidal Thoughts:  No  Memory:  Immediate;   Fair Recent;   Fair Remote;   Fair  Judgement:  Fair  Insight:  Fair  Psychomotor Activity:  Normal  Concentration:  Concentration: Fair and Attention Span: Fair  Recall:  [employer] of Knowledge:Fair  Language: Fair  Akathisia:  No  Handed:  Right  AIMS (if indicated):  not done  Assets:  Desire for Improvement Housing Social Support  ADL's:  Intact  Cognition: WNL  Sleep:  Poor   Screenings: AIMS    Flowsheet Row Admission (Discharged) from 05/28/2016 in [facility]  AIMS Total Score 0      AUDIT    Flowsheet Row Admission (Discharged) from 05/28/2016 in [facility] IN[facility]AL MEDICINE  Alcohol Use Disorder Identification Test Final Score (AUDIT) 3      GAD-7    Flowsheet Row Office Visit from 04/19/2023 in [facility]  Total GAD-7 Score 10      PHQ2-9    Flowsheet Row Office Visit from 04/19/2023 in [facility] Clinical Support from 09/19/2019 in [facility]  PHQ-2 Total Score 1 0      Flowsheet Row Office Visit from 04/19/2023 in [facility] from 12/09/2020 in [facility] at [facility] ED to Hosp-Admission (Discharged) from 06/14/2020 in [facility]  C-SSRS RISK CATEGORY No Risk No Risk Error: Question 6 not populated       Assessment and Plan: Gwendolyn Gonzales is a 38 year old Caucasian female with multiple psychiatric diagnoses including substance-induced psychotic disorder presents with worsening mood symptoms, comorbid medical problems including seizure-like spells, presented to establish care, discussed assessment and plan as noted below.   Generalized anxiety disorder-unstable Presents with significant anxiety and sleep disturbances, characterized by irritability, mood swings, and difficulty sleeping. No evidence of mania, hypomania, or psychosis. Reports a history of substance-induced psychotic disorder but has been clean for several years. Current stressors include pending disability application and legal investigations. Informed about the risks of starting new medication, including potential exacerbation of seizures, increased appetite, and weight gain. Advised to avoid drugs and alcohol while on medication. Discussed the need to contact neurologist before starting new medication to ensure it does not exacerbate seizures. - Start mirtazapine 7.5 mg at bedtime. - Refer to therapist for ongoing therapy - Instruct to contact neurologist before starting new medication - Advise to avoid drugs and alcohol while on medication - Warn about potential weight gain and increased appetite with medication - Start hydroxyzine 10 mg twice a day as needed for anxiety.  Insomnia-unstable Patient currently reports worsening sleep problems. - Start mirtazapine 7.5 mg at bedtime. - Patient to work on sleep hygiene.  High risk medication use - Ordered labs including TSH, urine drug screen.  Patient to go to [facility] lab.  History of alcohol/cocaine/methamphetamine/cannabis use -currently in remission. - Patient currently denies any use. - Ordered urine drug screen.   Follow-up in clinic in 3 to 4 weeks or sooner if needed.  Collaboration of Care: Referral or follow-up with counselor/therapist AEB patient encouraged to establish care with therapist  I have referred for the same.  Patient/Guardian was advised  Release of Information must be obtained prior to any record release in order to collaborate their care with an outside provider. Patient/Guardian was advised if they have not already done so to contact the registration department to sign all necessary forms in order for [place] to release information regarding their care.   Consent: Patient/Guardian gives verbal consent for treatment and assignment of benefits for services provided during this visit. Patient/Guardian expressed understanding and agreed to proceed.   Jomarie Longs, MD 2/3/20255:16 PM

## 2023-04-19 NOTE — Patient Instructions (Signed)
Mirtazapine Tablets What is this medication? MIRTAZAPINE (mir TAZ a peen) treats depression. It increases the amount of serotonin and norepinephrine in the brain, hormones that help regulate mood. This medicine may be used for other purposes; ask your health care provider or pharmacist if you have questions. COMMON BRAND NAME(S): Remeron What should I tell my care team before I take this medication? They need to know if you have any of these conditions: Bipolar disorder Glaucoma Kidney disease Liver disease Suicidal thoughts An unusual or allergic reaction to mirtazapine, other medications, foods, dyes, or preservatives Pregnant or trying to get pregnant Breast-feeding How should I use this medication? Take this medication by mouth with a glass of water. Follow the directions on the prescription label. Take your medication at regular intervals. Do not take your medication more often than directed. Do not stop taking this medication suddenly except upon the advice of your care team. Stopping this medication too quickly may cause serious side effects or your condition may worsen. A special MedGuide will be given to you by the pharmacist with each prescription and refill. Be sure to read this information carefully each time. Talk to your care team about the use of this medication in children. Special care may be needed. Overdosage: If you think you have taken too much of this medicine contact a poison control center or emergency room at once. NOTE: This medicine is only for you. Do not share this medicine with others. What if I miss a dose? If you miss a dose, take it as soon as you can. If it is almost time for your next dose, take only that dose. Do not take double or extra doses. What may interact with this medication? Do not take this medication with any of the following: Linezolid MAOIs, such as Carbex, Eldepryl, Marplan, Nardil, and Parnate Methylene blue (injected into a vein) This  medication may also interact with the following: Alcohol Antivirals for HIV or AIDS Certain medications that treat or prevent blood clots, such as warfarin Certain medications for fungal infections, such as ketoconazole and itraconazole Certain medications for mental health conditions Certain medications for migraine headache, such as almotriptan, eletriptan, frovatriptan, naratriptan, rizatriptan, sumatriptan, zolmitriptan Certain medications for seizures, such as carbamazepine or phenytoin Certain medications for sleep Cimetidine Erythromycin Fentanyl Lithium Medications for blood pressure Nefazodone Rasagiline Rifampin Supplements, such as St. John's wort, kava kava, valerian Tramadol Tryptophan This list may not describe all possible interactions. Give your health care provider a list of all the medicines, herbs, non-prescription drugs, or dietary supplements you use. Also tell them if you smoke, drink alcohol, or use illegal drugs. Some items may interact with your medicine. What should I watch for while using this medication? Visit your care team for regular checks on your progress. It may be some time before you see the benefit from this medication. This medication may cause thoughts of suicide or depression. This includes sudden changes in mood, behaviors, or thoughts. These changes can happen at any time but are more common in the beginning of treatment or after a change in dose. Call your care team right away if you experience these thoughts or worsening depression. This medication may affect your coordination, reaction time, or judgment. Do not drive or operate machinery until you know how this medication affects you. Sit up or stand slowly to reduce the risk of dizzy or fainting spells. Drinking alcohol with this medication can increase the risk of these side effects. This medication may cause dry  eyes and blurred vision. If you wear contact lenses, you may feel some discomfort.  Lubricating eye drops may help. See your care team if the problem does not go away or is severe. Your mouth may get dry. Chewing sugarless gum or sucking hard candy and drinking plenty of water may help. Contact your care team if the problem does not go away or is severe. What side effects may I notice from receiving this medication? Side effects that you should report to your care team as soon as possible: Allergic reactions--skin rash, itching, hives, swelling of the face, lips, tongue, or throat Heart rhythm changes--fast or irregular heartbeat, dizziness, feeling faint or lightheaded, chest pain, trouble breathing Infection--fever, chills, cough, or sore throat Irritability, confusion, fast or irregular heartbeat, muscle stiffness, twitching muscles, sweating, high fever, seizure, chills, vomiting, diarrhea, which may be signs of serotonin syndrome Low sodium level--muscle weakness, fatigue, dizziness, headache, confusion Rash, fever, and swollen lymph nodes Redness, blistering, peeling or loosening of the skin, including inside the mouth Seizures Sudden eye pain or change in vision such as blurry vision, seeing halos around lights, vision loss Thoughts of suicide or self-harm, worsening mood, feelings of depression Side effects that usually do not require medical attention (report to your care team if they continue or are bothersome): Constipation Dizziness Drowsiness Dry mouth Increase in appetite Weight gain This list may not describe all possible side effects. Call your doctor for medical advice about side effects. You may report side effects to FDA at 1-800-FDA-1088. Where should I keep my medication? Keep out of the reach of children. Store at room temperature between 15 and 30 degrees C (59 and 86 degrees F) Protect from light and moisture. Throw away any unused medication after the expiration date. NOTE: This sheet is a summary. It may not cover all possible information. If you  have questions about this medicine, talk to your doctor, pharmacist, or health care provider.  2024 Elsevier/Gold Standard (2021-10-29 00:00:00)Hydroxyzine Capsules or Tablets What is this medication? HYDROXYZINE (hye DROX i zeen) treats the symptoms of allergies and allergic reactions. It may also be used to treat anxiety or cause drowsiness before a procedure. It works by blocking histamine, a substance released by the body during an allergic reaction. It belongs to a group of medications called antihistamines. This medicine may be used for other purposes; ask your health care provider or pharmacist if you have questions. COMMON BRAND NAME(S): ANX, Atarax, Rezine, Vistaril What should I tell my care team before I take this medication? They need to know if you have any of these conditions: Glaucoma Heart disease Irregular heartbeat or rhythm Kidney disease Liver disease Lung or breathing disease, such as asthma Stomach or intestine problems Thyroid disease Trouble passing urine An unusual or allergic reaction to hydroxyzine, other medications, foods, dyes or preservatives Pregnant or trying to get pregnant Breastfeeding How should I use this medication? Take this medication by mouth with a full glass of water. Take it as directed on the prescription label at the same time every day. You can take it with or without food. If it upsets your stomach, take it with food. Talk to your care team about the use of this medication in children. While it may be prescribed for children as young as 6 years for selected conditions, precautions do apply. People 65 years and older may have a stronger reaction and need a smaller dose. Overdosage: If you think you have taken too much of this medicine contact a  poison control center or emergency room at once. NOTE: This medicine is only for you. Do not share this medicine with others. What if I miss a dose? If you miss a dose, take it as soon as you can. If  it is almost time for your next dose, take only that dose. Do not take double or extra doses. What may interact with this medication? Do not take this medication with any of the following: Cisapride Dronedarone Pimozide Thioridazine This medication may also interact with the following: Alcohol Antihistamines for allergy, cough, and cold Atropine Barbiturate medications for sleep or seizures, such as phenobarbital Certain antibiotics, such as erythromycin or clarithromycin Certain medications for anxiety or sleep Certain medications for bladder problems, such as oxybutynin or tolterodine Certain medications for irregular heartbeat Certain medications for mental health conditions Certain medications for Parkinson disease, such as benztropine, trihexyphenidyl Certain medications for seizures, such as phenobarbital or primidone Certain medications for stomach problems, such as dicyclomine or hyoscyamine Certain medications for travel sickness, such as scopolamine Ipratropium Opioid medications for pain Other medications that cause heart rhythm changes, such as dofetilide This list may not describe all possible interactions. Give your health care provider a list of all the medicines, herbs, non-prescription drugs, or dietary supplements you use. Also tell them if you smoke, drink alcohol, or use illegal drugs. Some items may interact with your medicine. What should I watch for while using this medication? Visit your care team for regular checks on your progress. Tell your care team if your symptoms do not start to get better or if they get worse. This medication may affect your coordination, reaction time, or judgment. Do not drive or operate machinery until you know how this medication affects you. Sit up or stand slowly to reduce the risk of dizzy or fainting spells. Drinking alcohol with this medication can increase the risk of these side effects. Your mouth may get dry. Chewing sugarless  gum or sucking hard candy and drinking plenty of water may help. Contact your care team if the problem does not go away or is severe. This medication may cause dry eyes and blurred vision. If you wear contact lenses, you may feel some discomfort. Lubricating eye drops may help. See your care team if the problem does not go away or is severe. If you are receiving skin tests for allergies, tell your care team you are taking this medication. What side effects may I notice from receiving this medication? Side effects that you should report to your care team as soon as possible: Allergic reactions--skin rash, itching, hives, swelling of the face, lips, tongue, or throat Heart rhythm changes--fast or irregular heartbeat, dizziness, feeling faint or lightheaded, chest pain, trouble breathing Side effects that usually do not require medical attention (report to your care team if they continue or are bothersome): Confusion Drowsiness Dry mouth Hallucinations Headache This list may not describe all possible side effects. Call your doctor for medical advice about side effects. You may report side effects to FDA at 1-800-FDA-1088. Where should I keep my medication? Keep out of the reach of children and pets. Store at room temperature between 15 and 30 degrees C (59 and 86 degrees F). Keep container tightly closed. Throw away any unused medication after the expiration date. NOTE: This sheet is a summary. It may not cover all possible information. If you have questions about this medicine, talk to your doctor, pharmacist, or health care provider.  2024 Elsevier/Gold Standard (2021-10-10 00:00:00)

## 2023-05-11 ENCOUNTER — Other Ambulatory Visit
Admission: RE | Admit: 2023-05-11 | Discharge: 2023-05-11 | Disposition: A | Discharge status: Home / Self Care | Payer: MEDICAID | Attending: Psychiatry | Admitting: Psychiatry

## 2023-05-11 DIAGNOSIS — Z79899 Other long term (current) drug therapy: Secondary | ICD-10-CM | POA: Diagnosis present

## 2023-05-11 LAB — TSH: TSH: 2.618 u[IU]/mL (ref 0.350–4.500)

## 2023-05-21 LAB — URINE DRUGS OF ABUSE SCREEN W ALC, ROUTINE (REF LAB)
Amphetamines, Urine: NEGATIVE ng/mL
Barbiturate, Ur: NEGATIVE ng/mL
Benzodiazepine Quant, Ur: NEGATIVE ng/mL
Cocaine (Metab.): NEGATIVE ng/mL
Methadone Screen, Urine: NEGATIVE ng/mL
Opiate Quant, Ur: NEGATIVE ng/mL
Phencyclidine, Ur: NEGATIVE ng/mL
Propoxyphene, Urine: NEGATIVE ng/mL

## 2023-05-21 LAB — ETHANOL CONFIRM, URINE: Ethanol, Ur - Confirmation: 0.055 %

## 2023-05-21 LAB — PANEL 799049
CARBOXY THC GC/MS CONF: 11 ng/mL
Cannabinoid GC/MS, Ur: POSITIVE — AB

## 2023-05-27 ENCOUNTER — Telehealth: Payer: Self-pay | Admitting: Psychiatry

## 2023-05-27 NOTE — Telephone Encounter (Signed)
 I have reviewed urine drug screen and in my chart message was sent the patient.  Looks like patient has not reviewed on my chart message.  Urine drug screen positive for cannabinoids and ethanol dated 05/11/2023.

## 2023-05-31 ENCOUNTER — Ambulatory Visit: Payer: MEDICAID | Admitting: Psychiatry

## 2023-05-31 ENCOUNTER — Telehealth: Payer: Self-pay | Admitting: Psychiatry

## 2023-05-31 NOTE — Telephone Encounter (Signed)
 Called patient to make aware of the lab results patient stated that she is not requesting any assistance. She last did cannabis a week ago, no alcohol for a few weeks. Patient stated that she will be negative on the next test

## 2023-05-31 NOTE — Telephone Encounter (Signed)
 Patient did not show for her appointment today, 05-31-23. There is documentation in Epic stating seh is attending [facility]. Voice messge and mychart message sent asking patient to call the office to reschedule appointment if she wants to continue care at this office and to verify coming to therapy appointment on 06-01-23.

## 2023-06-01 ENCOUNTER — Ambulatory Visit (INDEPENDENT_AMBULATORY_CARE_PROVIDER_SITE_OTHER): Payer: MEDICAID | Admitting: Professional Counselor

## 2023-06-01 DIAGNOSIS — F32A Depression, unspecified: Secondary | ICD-10-CM | POA: Diagnosis not present

## 2023-06-01 DIAGNOSIS — F411 Generalized anxiety disorder: Secondary | ICD-10-CM
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
## 2023-07-06 ENCOUNTER — Ambulatory Visit (INDEPENDENT_AMBULATORY_CARE_PROVIDER_SITE_OTHER): Payer: MEDICAID | Admitting: Professional Counselor

## 2023-07-06 DIAGNOSIS — F411 Generalized anxiety disorder: Secondary | ICD-10-CM

## 2023-07-06 DIAGNOSIS — F32A Depression, unspecified: Secondary | ICD-10-CM | POA: Diagnosis not present

## 2023-07-06 NOTE — Progress Notes (Signed)
  THERAPIST PROGRESS NOTE  Session Time: 2:53 PM - 3:30 PM  Participation Level: Active  Behavioral Response: Disheveled, Alert, Anxious  Type of Therapy: Individual Therapy  Treatment Goals addressed: Active Anxiety  LTG: "Just more peace. Find more calmness. Finding a routine, a little more specific."     Start:  07/06/23    Expected End:  07/04/24     STG: "More of like a meditative type of focus." To improve control of mind AEB using daily mindfulness exercises over the next 90 days   STG: "Less stress" To improve stress management AEB identifying triggers and using coping mechanisms over the next 90 days.    STG: "It (trauma) just makes you stay a step back from some things. It affects your small things." To reduce impact of trauma history AEB processing events and restructuring maladaptive patterns of thinking over the next 12 weeks.    ProgressTowards Goals: Initial  Interventions: CBT, Motivational Interviewing, and Supportive  Summary: Gwendolyn Gonzales is a 38 y.o. female who presents with a history of anxiety, depression, trauma, and polysubstance use. She appeared anxious but oriented x5. She reported things are going okay. She is working on her disability and has a hearing scheduled for next month. She reported her children are doing well. They celebrated Easter at home. Gwendolyn Gonzales reported her son is also engaging in therapy now that she shared with him that she is starting therapy. She engaged in developing her treatment plan. Gwendolyn Gonzales actively listened to coping skills and engaged in 5-4-3-2-1 grounding mechanism. She reported she is excited to start practicing these skills. Gwendolyn Gonzales would like to focus on trauma therapy once she has a good foundation of coping mechanisms.   Therapist Response: Conducted session with [name]. Began session with check-in/update since previous session. Utilized empathetic and reflective listening. Used open-ended questions to facilitate  discussion and summarized Gwendolyn Gonzales's thoughts/feelings. Developed treatment plan with input from [place] on current strengths, needs, and progress towards goals. Explained coping mechanisms (breathing exercises, TIP, STOP, RAIN, and urge surfing) and engaged Gwendolyn Gonzales in 5-4-3-2-1 grounding mechanism. Briefly explored trauma history and discussed plan for upcoming appointments. Scheduled additional appointment and concluded session.   Suicidal/Homicidal: No  Plan: Return again in 2 weeks.  Diagnosis: GAD (generalized anxiety disorder)  Depression, unspecified depression type  Collaboration of Care: Medication Management AEB chart review  Patient/Guardian was advised Release of Information must be obtained prior to any record release in order to collaborate their care with an outside provider. Patient/Guardian was advised if they have not already done so to contact the registration department to sign all necessary forms in order for us  to release information regarding their care.   Consent: Patient/Guardian gives verbal consent for treatment and assignment of benefits for services provided during this visit. Patient/Guardian expressed understanding and agreed to proceed.   Len Quale, [facility] 07/06/2023

## 2023-07-07 ENCOUNTER — Telehealth: Payer: Self-pay | Admitting: Psychiatry

## 2023-07-07 NOTE — Telephone Encounter (Signed)
 Noted.

## 2023-07-07 NOTE — Telephone Encounter (Signed)
 Please contact this patient to sign an ROI to release information to ' [employer] of [employer] and [employer]e [employer]' regarding a medical statement regarding physical and mental abilities and limitation for social security disability claim.

## 2023-07-12 ENCOUNTER — Telehealth: Payer: Self-pay | Admitting: Psychiatry

## 2023-07-12 NOTE — Telephone Encounter (Signed)
 Attempted to contact patient to come into sign an ROI to complete the medical statement form sent out by her attorney's office, patient agreed to come in today before 5 PM.  However according to staff patient did not show up.  Staff agrees to reach out to this patient again to let her know to come into sign a  ROI prior to completion of the form.

## 2023-07-13 ENCOUNTER — Encounter: Payer: Self-pay | Admitting: Psychiatry

## 2023-07-13 NOTE — Telephone Encounter (Signed)
 I have completed the mental health statement form as well as a letter elaborating details.  Will have staff fax it to patient's attorney's office.

## 2023-07-15 ENCOUNTER — Telehealth: Payer: Self-pay | Admitting: Psychiatry

## 2023-07-15 NOTE — Telephone Encounter (Signed)
 Providers mental health note, letter, and ROI faxed 07-14-23 to [facility] office per request. Also the  note, ROI, and fax confirmation scanned and sent to [facility]. Mental health letter not scanned since in patient's MyChart.

## 2023-07-22 ENCOUNTER — Ambulatory Visit: Payer: MEDICAID | Admitting: Professional Counselor

## 2023-08-02 ENCOUNTER — Ambulatory Visit (INDEPENDENT_AMBULATORY_CARE_PROVIDER_SITE_OTHER): Payer: MEDICAID | Admitting: Professional Counselor

## 2023-08-02 DIAGNOSIS — F411 Generalized anxiety disorder: Secondary | ICD-10-CM

## 2023-08-02 NOTE — Progress Notes (Signed)
  THERAPIST PROGRESS NOTE  Session Time: 3:04 PM - 3:34 PM  Participation Level: Active  Behavioral Response: Casual, Alert, Anxious  Type of Therapy: Individual Therapy  Treatment Goals addressed: Active Anxiety  LTG: "Just more peace. Find more calmness. Finding a routine, a little more specific."                 Start:  07/06/23    Expected End:  07/04/24      STG: "More of like a meditative type of focus." To improve control of mind AEB using daily mindfulness exercises over the next 90 days    STG: "Less stress" To improve stress management AEB identifying triggers and using coping mechanisms over the next 90 days.     STG: "It (trauma) just makes you stay a step back from some things. It affects your small things." To reduce impact of trauma history AEB processing events and restructuring maladaptive patterns of thinking over the next 12 weeks.    ProgressTowards Goals: Progressing  Interventions: CBT and Supportive  Summary: Gwendolyn Gonzales is a 38 y.o. female who presents with a history of anxiety, depression, trauma, and polysubstance use. She appeared anxious but oriented x5. She provided updates on her disability hearing. [name] reported coping skills have been helpful. She engaged in writing exercise. [name] was able to identify and restructure unhealthy thinking patterns. She was receptive to keeping a thought log to help continue building her awareness around her thinking and how it contributes to her feelings/behaviors.   Therapist Response: Conducted session with [name]. Began session with check-in/update since previous session. Utilized empathetic and reflective listening. Engaged in writing exercise, "Cracking the NUTS and eliminating the ANTS." Provided psychoeducation on cognitive model and cognitive distortions. Assisted with identifying and restructuring unhealthy thinking patterns. Assigned homework to keep thought log. Scheduled additional appointment and  concluded session.   Suicidal/Homicidal: No  Plan: Return again in 2 weeks.  Diagnosis: GAD (generalized anxiety disorder)  Collaboration of Care: Medication Management AEB chart review  Patient/Guardian was advised Release of Information must be obtained prior to any record release in order to collaborate their care with an outside provider. Patient/Guardian was advised if they have not already done so to contact the registration department to sign all necessary forms in order for us  to release information regarding their care.   Consent: Patient/Guardian gives verbal consent for treatment and assignment of benefits for services provided during this visit. Patient/Guardian expressed understanding and agreed to proceed.   Len Quale, [facility] 08/02/2023

## 2023-08-03 ENCOUNTER — Emergency Department
Admission: EM | Admit: 2023-08-03 | Discharge: 2023-08-04 | Disposition: A | Discharge status: Other Acute Inpatient Hospital | Payer: MEDICAID | Attending: Emergency Medicine | Admitting: Emergency Medicine

## 2023-08-03 ENCOUNTER — Other Ambulatory Visit: Payer: Self-pay

## 2023-08-03 ENCOUNTER — Encounter: Payer: Self-pay | Admitting: Emergency Medicine

## 2023-08-03 DIAGNOSIS — Z87898 Personal history of other specified conditions: Secondary | ICD-10-CM

## 2023-08-03 DIAGNOSIS — F29 Unspecified psychosis not due to a substance or known physiological condition: Secondary | ICD-10-CM | POA: Diagnosis present

## 2023-08-03 DIAGNOSIS — F1511 Other stimulant abuse, in remission: Secondary | ICD-10-CM | POA: Diagnosis present

## 2023-08-03 DIAGNOSIS — F1990 Other psychoactive substance use, unspecified, uncomplicated: Secondary | ICD-10-CM

## 2023-08-03 DIAGNOSIS — R451 Restlessness and agitation: Secondary | ICD-10-CM | POA: Diagnosis not present

## 2023-08-03 DIAGNOSIS — F1010 Alcohol abuse, uncomplicated: Secondary | ICD-10-CM | POA: Diagnosis not present

## 2023-08-03 DIAGNOSIS — F19959 Other psychoactive substance use, unspecified with psychoactive substance-induced psychotic disorder, unspecified: Secondary | ICD-10-CM | POA: Diagnosis present

## 2023-08-03 DIAGNOSIS — F1210 Cannabis abuse, uncomplicated: Secondary | ICD-10-CM | POA: Insufficient documentation

## 2023-08-03 DIAGNOSIS — F1491 Cocaine use, unspecified, in remission: Secondary | ICD-10-CM

## 2023-08-03 DIAGNOSIS — F1220 Cannabis dependence, uncomplicated: Secondary | ICD-10-CM | POA: Diagnosis present

## 2023-08-03 LAB — COMPREHENSIVE METABOLIC PANEL WITH GFR
ALT: 15 U/L (ref 0–44)
AST: 21 U/L (ref 15–41)
Albumin: 4 g/dL (ref 3.5–5.0)
Alkaline Phosphatase: 70 U/L (ref 38–126)
Anion gap: 8 (ref 5–15)
BUN: 6 mg/dL (ref 6–20)
CO2: 25 mmol/L (ref 22–32)
Calcium: 8.6 mg/dL — ABNORMAL LOW (ref 8.9–10.3)
Chloride: 108 mmol/L (ref 98–111)
Creatinine, Ser: 0.58 mg/dL (ref 0.44–1.00)
GFR, Estimated: >60 mL/min (ref >60)
Glucose, Bld: 92 mg/dL (ref 70–99)
Potassium: 3.6 mmol/L (ref 3.5–5.1)
Sodium: 141 mmol/L (ref 135–145)
Total Bilirubin: 0.2 mg/dL (ref 0.0–1.2)
Total Protein: 7 g/dL (ref 6.5–8.1)

## 2023-08-03 LAB — URINE DRUG SCREEN, QUALITATIVE (ARMC ONLY)
Amphetamines, Ur Screen: NOT DETECTED
Barbiturates, Ur Screen: NOT DETECTED
Benzodiazepine, Ur Scrn: NOT DETECTED
Cannabinoid 50 Ng, Ur ~~LOC~~: NOT DETECTED
Cocaine Metabolite,Ur ~~LOC~~: NOT DETECTED
MDMA (Ecstasy)Ur Screen: NOT DETECTED
Methadone Scn, Ur: NOT DETECTED
Opiate, Ur Screen: NOT DETECTED
Phencyclidine (PCP) Ur S: NOT DETECTED
Tricyclic, Ur Screen: NOT DETECTED

## 2023-08-03 LAB — CBC
HCT: 48.3 % — ABNORMAL HIGH (ref 36.0–46.0)
Hemoglobin: 16.2 g/dL — ABNORMAL HIGH (ref 12.0–15.0)
MCH: 30.2 pg (ref 26.0–34.0)
MCHC: 33.5 g/dL (ref 30.0–36.0)
MCV: 89.9 fL (ref 80.0–100.0)
Platelets: 290 10*3/uL (ref 150–400)
RBC: 5.37 MIL/uL — ABNORMAL HIGH (ref 3.87–5.11)
RDW: 13.8 % (ref 11.5–15.5)
WBC: 6.8 10*3/uL (ref 4.0–10.5)
nRBC: 0 % (ref 0.0–0.2)

## 2023-08-03 LAB — ETHANOL: Alcohol, Ethyl (B): 141 mg/dL — ABNORMAL HIGH (ref <15)

## 2023-08-03 LAB — ACETAMINOPHEN LEVEL: Acetaminophen (Tylenol), Serum: <10 ug/mL — ABNORMAL LOW (ref 10–30)

## 2023-08-03 LAB — SALICYLATE LEVEL: Salicylate Lvl: <7 mg/dL — ABNORMAL LOW (ref 7.0–30.0)

## 2023-08-03 LAB — POC URINE PREG, ED: Preg Test, Ur: NEGATIVE

## 2023-08-03 MED ORDER — BUPRENORPHINE HCL-NALOXONE HCL 2-0.5 MG SL SUBL
2.0000 | SUBLINGUAL_TABLET | Freq: Every day | SUBLINGUAL | Status: DC
  Administered 2023-08-03 – 2023-08-04 (×2): 2 via SUBLINGUAL
  Filled 2023-08-03 (×2): qty 2

## 2023-08-03 MED ORDER — ADULT MULTIVITAMIN W/MINERALS CH
1.0000 | ORAL_TABLET | Freq: Every day | ORAL | Status: DC
  Administered 2023-08-03 – 2023-08-04 (×2): 1 via ORAL
  Filled 2023-08-03 (×2): qty 1

## 2023-08-03 MED ORDER — HYDROXYZINE HCL 10 MG PO TABS: 10.0000 mg | ORAL_TABLET | Freq: Two times a day (BID) | ORAL | Status: DC | PRN

## 2023-08-03 MED ORDER — IBUPROFEN 800 MG PO TABS: 800.0000 mg | ORAL_TABLET | Freq: Three times a day (TID) | ORAL | Status: DC | PRN

## 2023-08-03 MED ORDER — NICOTINE POLACRILEX 2 MG MT GUM: 2.0000 mg | CHEWING_GUM | OROMUCOSAL | Status: DC

## 2023-08-03 MED ORDER — LIDOCAINE 5 % EX PTCH
1.0000 | MEDICATED_PATCH | Freq: Every day | CUTANEOUS | Status: DC
  Filled 2023-08-03: qty 1

## 2023-08-03 MED ORDER — MIRTAZAPINE 15 MG PO TABS
15.0000 mg | ORAL_TABLET | Freq: Every day | ORAL | Status: DC
  Administered 2023-08-03: 15 mg via ORAL
  Filled 2023-08-03: qty 1

## 2023-08-03 MED ORDER — NICOTINE 14 MG/24HR TD PT24
14.0000 mg | MEDICATED_PATCH | Freq: Every day | TRANSDERMAL | Status: DC
  Administered 2023-08-03 – 2023-08-04 (×2): 14 mg via TRANSDERMAL
  Filled 2023-08-03 (×2): qty 1

## 2023-08-03 MED ORDER — LORAZEPAM 2 MG PO TABS
2.0000 mg | ORAL_TABLET | Freq: Once | ORAL | Status: AC
  Administered 2023-08-03: 2 mg via ORAL
  Filled 2023-08-03: qty 1

## 2023-08-03 MED ORDER — LEVETIRACETAM 500 MG PO TABS
500.0000 mg | ORAL_TABLET | Freq: Three times a day (TID) | ORAL | Status: DC
  Administered 2023-08-03 – 2023-08-04 (×3): 500 mg via ORAL
  Filled 2023-08-03 (×6): qty 1

## 2023-08-03 MED ORDER — BUPRENORPHINE HCL-NALOXONE HCL 8-2 MG SL SUBL
1.0000 | SUBLINGUAL_TABLET | Freq: Every day | SUBLINGUAL | Status: DC
  Administered 2023-08-03 – 2023-08-04 (×2): 1 via SUBLINGUAL
  Filled 2023-08-03 (×2): qty 1

## 2023-08-03 MED ORDER — ALBUTEROL SULFATE (2.5 MG/3ML) 0.083% IN NEBU: 3.0000 mL | INHALATION_SOLUTION | Freq: Four times a day (QID) | RESPIRATORY_TRACT | Status: DC | PRN

## 2023-08-03 NOTE — Consult Note (Signed)
 Patient evaluated by psychiatry and referred for inpatient admission.  Complete consult note pending.

## 2023-08-03 NOTE — ED Notes (Signed)
IVC pending reassessment 

## 2023-08-03 NOTE — BH Assessment (Signed)
 Comprehensive Clinical Assessment (CCA) Screening, Triage and Referral Note  08/03/2023 Gwendolyn Gonzales [id number] Recommendations for Services/Supports/Treatments: Consulted with Stan Eans., NP, who recommended pt. for continued observation and reassessment in the AM.   Gwendolyn Gonzales is a 38 year old, English speaking, Caucasian female. Pt presented to [facility] ED under IVC. Per triage note: Patient ambulatory to triage with steady gait, without difficulty or distress noted, in custody of Product manager for [employer]; pt st "for government reasons"; pt denies HI or SI  Pt was resting upon this writer's arrival. Pt's speech was loose and nonsensical. Pt's thought process was irrelevant throughout the assessment. Pt was unable to answer assessment questions appropriately. Pt was difficult to track and had disorganized behavior. Pt unable to explain why she'd presented to the hospital. Pt has absent insight and judgment. Pt denied having symptoms of depression or anxiety. Pt was oriented x4. Motor behavior was normal. Eye contact was poor. Pt noted to be responding to internal stimuli. Pt's mood is euthymic; affect is responsive. The patient denied having SI, HI or AV/H. Pt admitted to using a THC vape earlier in the day. Pt denied all other substance use. Chief Complaint:  Chief Complaint  Patient presents with   Mental Health Problem   Visit Diagnosis: Substance induced mood disorder  Patient Reported Information How did you hear about us ? Other (Comment)  What Is the Reason for Your Visit/Call Today? Establish therapy  How Long Has This Been Causing You Problems? > than 6 months  What Do You Feel Would Help You the Most Today? Stress Management; Treatment for Depression or other mood problem   Have You Recently Had Any Thoughts About Hurting Yourself? No  Are You Planning to Commit Suicide/Harm Yourself At This time? No   Have you Recently Had Thoughts About Hurting Someone  Gwendolyn Gonzales? No  Are You Planning to Harm Someone at This Time? No  Explanation: No data recorded  Have You Used Any Alcohol or Drugs in the Past 24 Hours? No  How Long Ago Did You Use Drugs or Alcohol? No data recorded What Did You Use and How Much? No data recorded  Do You Currently Have a Therapist/Psychiatrist? Yes  Name of Therapist/Psychiatrist: Dr.Eappen   Have You Been Recently Discharged From Any Office Practice or Programs? No  Explanation of Discharge From Practice/Program: No data recorded   CCA Screening Triage Referral Assessment Type of Contact: Face-to-Face  Telemedicine Service Delivery:   Is this Initial or Reassessment?   Date Telepsych consult ordered in [facility]:    Time Telepsych consult ordered in [facility]:    Location of Assessment: No data recorded Provider Location: No data recorded   Collateral Involvement: No data recorded  Does Patient Have a Court Appointed Legal Guardian? No data recorded Name and Contact of Legal Guardian: No data recorded If Minor and Not Living with Parent(s), Who has Custody? No data recorded Is CPS involved or ever been involved? Never  Is APS involved or ever been involved? Never   Patient Determined To Be At Risk for Harm To Self or Others Based on Review of Patient Reported Information or Presenting Complaint? No data recorded Method: No data recorded Availability of Means: No data recorded Intent: No data recorded Notification Required: No data recorded Additional Information for Danger to Others Potential: No data recorded Additional Comments for Danger to Others Potential: No data recorded Are There Guns or Other Weapons in Your Home? No  Types of Guns/Weapons: No data recorded Are  These Weapons Safely Secured?                            No data recorded Who Could Verify You Are Able To Have These Secured: No data recorded Do You Have any Outstanding Charges, Pending Court Dates, Parole/Probation? "No."  Contacted To  Inform of Risk of Harm To Self or Others: No data recorded  Does Patient Present under Involuntary Commitment? No    [place] of Residence: [place]   Patient Currently Receiving the Following Services: Medication Management   Determination of Need: Routine (7 days)   Options For Referral: Outpatient Therapy   Disposition Recommendation per psychiatric provider: Overnight observation and reassessment in the AM.   Madelyne Millikan R Lokelani Lutes, LCAS

## 2023-08-03 NOTE — ED Notes (Addendum)
 With this nurse and EDT Sage present, pt removes black shorts, black sports bra, grey flip flops,blue bra, 1 silver tone nipple bar, 12 silver tone rings, 1 blue belly ring, 3 gold tone earrings, 1 silver tone earring, 5 black hair bands, 2 plastic beaded bracelets, black watch; unable to remove 2 nose hoops; pt has 2 silver tone earrings and 1 black earring,1 gold nose ring and 1 black nose ring remain in place, pt unable to remove and this nurse also attempted unsuccessfully to [facility] nurse A Arvis Laura notif

## 2023-08-03 NOTE — ED Notes (Signed)
 Pt talking with dr Margery Sheets.  Pt reports she is with the [employer] and has a badge.  Pt admits to etoh and gummies today.  Pt loud and cursing at times.

## 2023-08-03 NOTE — ED Provider Notes (Signed)
 [facility] Provider Note    None    (approximate)   History   Mental Health Problem   HPI  Gwendolyn Gonzales is a 38 y.o. 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  Per IVC report patient has been stating that she is being bothered by aliens, claims to be part of the [employer].  When I interview her she says that she is part of the [employer] and [employer] and has paperwork to prove it.  She makes no mention of aliens at this time.  She does laugh aloud at times, warning others in her vicinity "you better watch it!"    She does state that she took edible cannabinoid product and drank alcohol tonight.  She denies any self-harm attempts.    She has no other acute medical complaints.  External Medical Documents Reviewed: IVC paperwork reviewed as above      Physical Exam   Triage Vital Signs: ED Triage Vitals  Encounter Vitals Group     BP 08/03/23 0010 (!) 137/91     Systolic BP Percentile --      Diastolic BP Percentile --      Pulse Rate 08/03/23 0010 (!) 115     Resp 08/03/23 0010 20     Temp 08/03/23 0010 98.5 F (36.9 C)     Temp src --      SpO2 08/03/23 0010 100 %     Weight 08/03/23 0009 145 lb (65.8 kg)     Height 08/03/23 0009 5\' 2"  (1.575 m)     Head Circumference --      Peak Flow --      Pain Score 08/03/23 0008 0     Pain Loc --      Pain Education --      Exclude from Growth Chart --     Most recent vital signs: Vitals:   08/03/23 0010  BP: (!) 137/91  Pulse: (!) 115  Resp: 20  Temp: 98.5 F (36.9 C)  SpO2: 100%    General: Awake, no distress.  CV:  Good peripheral perfusion.  Resp:  Normal effort.  Abd:  No distention.  Other:  Awake at times becomes agitated yelling out loud at staff.  She is slightly tachycardic.  She has no obvious signs of head trauma or trauma to the arms.  She has clear lungs  without focality or wheezing and a soft benign abdominal exam.   ED Results / Procedures / Treatments   Labs (all labs ordered are listed, but only abnormal results are displayed) Labs Reviewed  COMPREHENSIVE METABOLIC PANEL WITH GFR - Abnormal; Notable for the following components:      Result Value   Calcium 8.6 (*)    All other components within normal limits  CBC - Abnormal; Notable for the following components:   RBC 5.37 (*)    Hemoglobin 16.2 (*)    HCT 48.3 (*)    All other components within normal limits  URINE DRUG SCREEN, QUALITATIVE ([facility] ONLY)  ETHANOL  ACETAMINOPHEN  LEVEL  SALICYLATE LEVEL  POC URINE PREG, ED     I ordered and reviewed the above labs they are notable for cell counts and electrolytes unremarkable.  EKG  ED ECG REPORT I, Buell Carmin, the attending physician, personally viewed and interpreted this ECG.   Date: 08/03/2023  EKG Time: 0052  Rate: 138  Rhythm: sinus tachy  Axis: nl  Intervals:rbbb  ST&T Change: no stemi    PROCEDURES:  Critical Care performed: No  Procedures   MEDICATIONS ORDERED IN ED: Medications  LORazepam  (ATIVAN ) tablet 2 mg (2 mg Oral Given 08/03/23 0149)     IMPRESSION / MDM / ASSESSMENT AND PLAN / ED COURSE  I reviewed the triage vital signs and the nursing notes.                                Patient's presentation is most consistent with acute presentation with potential threat to life or bodily function.  Differential diagnosis includes, but is not limited to, alcohol intoxication, substance use, other intoxication, psychosis substance-induced psychosis     MDM: Admits to substance use and does appear intoxicated either from alcohol or other drug use, as she is behaving erratically.  Presents under police IVC for psychosis.  I got her typical basic labs and toxicologic labs.  She has no other acute medical complaints and otherwise looks well aside from her tachycardia which I think is likely due to  her substance use.  Cleared for psychiatric evaluation under IVC.       FINAL CLINICAL IMPRESSION(S) / ED DIAGNOSES   Final diagnoses:  Substance use  Psychosis, unspecified psychosis type (HCC)     Rx / DC Orders   ED Discharge Orders     None        Note:  This document was prepared using Dragon voice recognition software and may include unintentional dictation errors.    Buell Carmin, MD 08/03/23 [phone]

## 2023-08-03 NOTE — Group Note (Signed)
 Date:  08/03/2023 Time:  9:46 PM  Group Topic/Focus:  Wrap-Up Group:   The focus of this group is to help patients review their daily goal of treatment and discuss progress on daily workbooks.    Participation Level:  Minimal  Participation Quality:  Appropriate and Attentive  Affect:  Appropriate  Cognitive:  Alert  Insight: Appropriate  Engagement in Group:  Engaged  Modes of Intervention:  Discussion  Additional Comments:     Maglione,Delorean Knutzen E 08/03/2023, 9:46 PM

## 2023-08-03 NOTE — BH Assessment (Signed)
 PATIENT BED AVAILABLE AFTER 9AM ON 08/04/23  Patient has been accepted to [facility].  Patient assigned to [facility] Accepting physician is Dr. Berl Breed.  Call report to [phone].  Representative was Deon.   ER Staff is aware of it:  [facility] ER Secretary  Dr. Karlynn Oyster, ER MD  Marlis Simper. Patient's Nurse

## 2023-08-03 NOTE — ED Notes (Signed)
 Pt in hallway bed.  Pt cursing and talking with officers and security

## 2023-08-03 NOTE — Consult Note (Signed)
 [facility] Psychiatric Consult Initial  Patient Name: .Gwendolyn Gonzales  MRN: [id number]  DOB: 1985-08-03  Consult Order details:  Orders (From admission, onward)     Start     Ordered   08/03/23 0041  IP CONSULT TO PSYCHIATRY       Ordering Provider: Buell Carmin, MD  Provider:  (Not yet assigned)  Question Answer Comment  Place call to: ED   Reason for Consult Admit      08/03/23 0040   08/03/23 0041  CONSULT TO CALL ACT TEAM       Ordering Provider: Buell Carmin, MD  Provider:  (Not yet assigned)  Question:  Reason for Consult?  Answer:  Psych consult   08/03/23 0040             Mode of Visit: Tele-visit Virtual Statement:TELE PSYCHIATRY ATTESTATION & CONSENT As the provider for this telehealth consult, I attest that I verified the patient's identity using two separate identifiers, introduced myself to the patient, provided my credentials, disclosed my location, and performed this encounter via a HIPAA-compliant, real-time, face-to-face, two-way, interactive audio and video platform and with the full consent and agreement of the patient (or guardian as applicable.) Patient physical location: [facility] Emergency Department. Telehealth provider physical location: home office in state of [place].   Video start time: 1030 Video end time: 1101    Psychiatry Consult Evaluation  Service Date: Aug 03, 2023 LOS:  LOS: 0 days  Chief Complaint   Primary Psychiatric Diagnoses  Substance induced psychotic disorder 2.  History of Alcohol Abuse 3. Cannabis use disorder, moderate  Assessment  Gwendolyn Gonzales is a 38 y.o. female admitted: Presented to the EDfor 08/03/2023 12:38 AM via IVC for delusional concerns of involving being kidnapped by aliens.  On admission, her BAL was 141; patient's urine drug screen was negative, however she did endorse using THC vape prior to admission, which better explains her psychosis.  She does have a psychiatric hx for substance usage and substance  induced psychotic disorders. She's not tremulous and not endorsing other alcohol or drug withdrawal concerns.  She carries the psychiatric diagnoses of alcohol abuse, cannabis abuse, GAD, cocaine abuse in remission, methamphetamine psychotic disorder with delusions, accidental drug overdose and has a past medical history of  asthma, Gastrointestinal Bleeding.   Her current presentation of paranoia and delusional thoughts, disorientation,pressured and rambling speech is most consistent with substance induced psychosis. Patient lacks insight into her mental decompensations which contributes to impaired judgment and acute safety concerns outside of the confounds of the hospital.  She meets criteria for inpatient psychiatric admission based on above.  Current outpatient psychotropic medications include suboxone, hydroxyzine , mirtazapine  and historically she has had a good response to these medications. She was reports she was medication compliant but her mentally decompensated presentations suggests subtherapeutic doses or medication non-adherence; She also has a hx for polysubstance usage which can directly impact medication efficacy. Patient has been evaluated and medically cleared prior to assessment.  Referral for admission was discussed with patient who accepts.   On initial examination, patient is alert and oriented x5; she is nervous but cooperative and requests to go home.  She denies suicidal or homicidal thoughts.  Her thoughts are devoid of psychosis, delusion or paranoia.    Please see plan below for detailed recommendations.   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 Patient is under voluntary admission status at this time; please IVC if attempts to leave hospital.  ## Behavioral / Environmental: -Patient would benefit from more frequent contact with medical team to delineate plan of care and allow for clarification questions, which will help alleviate anxiety regarding treatment. If possible, try to check back in with the pt in the afternoon. or Utilize compassion and acknowledge the patient's experiences while setting clear and realistic expectations for care.    ## Safety and Observation Level:  - Based on my clinical evaluation, I estimate the patient to be at low risk of self harm in the current setting. - At this time, we recommend  routine. This decision is based on my review of the chart including patient's history and current presentation, interview of the patient, mental status examination, and consideration of suicide risk including evaluating suicidal ideation, plan, intent, suicidal or self-harm behaviors, risk factors, and protective factors. This judgment is based on our ability to directly address suicide risk, implement suicide prevention strategies, and develop a safety plan while the patient is in the clinical setting. Please contact our team if there is a concern that risk level has changed.  CSSR Risk Category:C-SSRS RISK CATEGORY: No Risk  Suicide Risk Assessment: Patient has following modifiable risk factors for suicide: under treated depression , recklessness, and medication noncompliance, which we are addressing by referral for inpatient admission, restarting psychiatric medications and  safety monitoring. Patient has following non-modifiable or demographic risk factors for suicide: psychiatric hospitalization Patient has the following protective factors against suicide: Access to outpatient mental health care  Thank you for this consult request. Recommendations have been communicated to the primary team.  We will sign at this time.   Doneen Fuelling, NP       History of Present Illness  Relevant Aspects of Hospital ED Course:  Admitted on 08/03/2023 for mental health evaluation.   Patient Report:  Patient observed in private exam room; she's greeted by the mental health team and given anticipatory guidance. She nods her head in agreement to assessment.  Her appearance is disheveled and she appears nervous.  Patient is alert and oriented to person, place but thinks today is August 20th.  She reports she came to the hospital d/t being aggravated by her family and friends.  She is very talkative but at times she rambles and her speech becomes incoherent.   She reports a hx for PTSD, and bipolar depression and reports taking hydrozyzine and mirtazapine . For the most part she reports taking her medications, admits to missed doses but does not quantify. She reports being followed by [facility], last appt was July 02, 2023. She currently denies SI, HI or AVH.  She reports she drank alcohol prior to admission but does not do this daily; She reports she uses THC gummies at night to help with sleep, but not regularly.  She denies other illicit drug usage, however, she is prescribed suboxone and chart review shows significant hx for polysubstance abuse.  We discussed with information listed in her IVC.  Patient collaborates her belief that she and another family member were both kidnapped by aliens and the abduction was witnessed by Psychologist, prison and probation services.   She is insistent the police officers can validate her story. She does appear upset and personally tormenting by her claims of alien abduction. She  lacks insight into her concerns which contributes to impaired judgment.     Psych ROS:  Depression: yes Anxiety:  yes Mania (lifetime and current): unsure as patient unable to answer question Psychosis: (lifetime and current): current  Collateral information:  Deferred  Review of Systems  Constitutional: Negative.   HENT: Negative.    Eyes: Negative.   Respiratory: Negative.    Cardiovascular: Negative.   Gastrointestinal: Negative.   Genitourinary: Negative.   Musculoskeletal: Negative.   Skin: Negative.   Neurological: Negative.   Endo/Heme/Allergies: Negative.      Psychiatric and Social History  Psychiatric History:  Information collected from patient and chart review.   HPI   Gwendolyn Gonzales is a 38 y.o. 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  Per IVC report patient has been stating that she is being bothered by aliens, claims to be part of the [employer].  When I interview her she says that she is part of the [employer] and [employer] and has paperwork to prove it.  She makes no mention of aliens at this time.  She does laugh aloud at times, warning others in her vicinity "you better watch it!"    She does state that she took edible cannabinoid product and drank alcohol tonight.  She denies any self-harm attempts.     She has no other acute medical complaints.  Prev Dx/Sx: depression, anxiety Current Psych Provider: [facility] Home Meds (current): as listed below Previous Med Trials: olanzapine  Therapy: unknown  Prior Psych Hospitalization: yes, year ago   Prior Self Harm: denies Prior Violence: denies  Family Psych History: hydroxyzine , mirtazapine , suboxone Family Hx suicide: denies  Social History: deferred d/t patient mental decompensation and limited participation in assessment.  Developmental Hx: unknown Educational Hx: unknown Occupational  Hx: unknown Legal Hx: unknown Living Situation: unknown  Spiritual Hx: unknown Access to weapons/lethal means: unknown    Substance History Alcohol: yes  Type of alcohol 12% beers, 24oz Last Drink prior to admission Number of drinks per day denies History of alcohol withdrawal seizures denies  History of DT's denies Tobacco: denies Illicit drugs: THC gummies to help with sleep.  Prescription drug abuse: she denies Rehab hx: she denies but does take suboxone; she is mentally decompensated and has difficulty maintain  Exam Findings  Physical Exam:  Vital Signs:  Temp:  [98.2 F (36.8 C)-98.5 F (36.9 C)] 98.2 F (36.8 C) (05/20 0813) Pulse Rate:  [100-115] 100 (05/20 0813) Resp:  [18-20] 18 (05/20 0813) BP: (110-137)/(69-91) 110/69 (05/20 0813) SpO2:  [97 %-100 %] 97 % (05/20 0813) Weight:  [65.8 kg] 65.8 kg (05/20 0009) Blood pressure 110/69, pulse 100, temperature 98.2 F (36.8 C), temperature source Oral, resp. rate 18, height 5\' 2"  (1.575 m), weight 65.8 kg, last menstrual period 07/13/2023, SpO2 97%. Body mass index is 26.52 kg/m.  Physical Exam Cardiovascular:     Rate and Rhythm: Normal rate.     Pulses: Normal pulses.  Musculoskeletal:        General: Normal range of motion.  Cervical back: Normal range of motion.  Neurological:     Mental Status: She is alert. She is disoriented.  Psychiatric:        Mood and Affect: Mood is anxious.        Speech: Speech is rapid and pressured.        Behavior: Behavior is cooperative.        Thought Content: Thought content is delusional.        Cognition and Memory: Cognition is impaired. Memory is impaired.        Judgment: Judgment is impulsive.     Mental Status Exam: General Appearance: Disheveled  Orientation:  Other:  to person, place but cannot state today;state date and time.   Memory:  Immediate;   Fair Recent;   Fair Remote;   Fair  Concentration:  Concentration: Fair and Attention Span: Fair  Recall:   Fair  Attention  Fair  Eye Contact:  Fair  Speech:  Pressured  Language:  Fair  Volume:  Increased  Mood: Anxious  Affect:  Blunt and Congruent  Thought Process:  Irrelevant  Thought Content:  Illogical and Delusions  Suicidal Thoughts:  No  Homicidal Thoughts:  No  Judgement:  Impaired  Insight:  Lacking  Psychomotor Activity:  Increased  Akathisia:  No  Fund of Knowledge:  Poor      Assets:  Architect Housing  Cognition:  Impaired,  Mild  ADL's:  Impaired  AIMS (if indicated):        Other History   These have been pulled in through the EMR, reviewed, and updated if appropriate.  Family History:  The patient's family history includes Anxiety disorder in her mother; Breast cancer in her paternal aunt; Diabetes in her maternal grandmother; High Cholesterol in her maternal grandfather, maternal grandmother, and mother; Hypertension in her maternal grandfather, maternal grandmother, and mother; Leukemia in her maternal grandmother; Lung cancer in her father and paternal aunt; Ovarian cancer in her paternal aunt; Throat cancer in her father and paternal aunt.  Medical History: Past Medical History:  Diagnosis Date  . Anxiety   . Depression   . History of abnormal cervical Pap smear    "years ago"  . History of asthma    as child  . History of chlamydia   . History of urinary tract infection   . Seizure (HCC)    07/2019    Surgical History: Past Surgical History:  Procedure Laterality Date  . CESAREAN SECTION    . CESAREAN SECTION  15 May 2006  . FLEXIBLE BRONCHOSCOPY W/ UPPER ENDOSCOPY    . INNER EAR SURGERY       Medications:   Current Facility-Administered Medications:  .  albuterol  (PROVENTIL ) (2.5 MG/3ML) 0.083% nebulizer solution 3 mL, 3 mL, Inhalation, Q6H PRN, Jesenya Bowditch E, NP .  buprenorphine-naloxone (SUBOXONE) 8-2 mg per SL tablet 1 tablet, 1 tablet, Sublingual, Daily, 1 tablet at 08/03/23 1338 **AND**  buprenorphine-naloxone (SUBOXONE) 2-0.5 mg per SL tablet 2 tablet, 2 tablet, Sublingual, Daily, Orvil Bland E, NP, 2 tablet at 08/03/23 1338 .  hydrOXYzine  (ATARAX ) tablet 10 mg, 10 mg, Oral, BID PRN, Orvil Bland E, NP .  ibuprofen  (ADVIL ) tablet 800 mg, 800 mg, Oral, Q8H PRN, Orvil Bland E, NP .  levETIRAcetam  (KEPPRA ) tablet 500 mg, 500 mg, Oral, TID, Orvil Bland E, NP .  lidocaine  (LIDODERM ) 5 % 1 patch, 1 patch, Transdermal, Daily, Orvil Bland E, NP .  mirtazapine  (REMERON ) tablet 15 mg, 15  mg, Oral, QHS, Jennalee Greaves E, NP .  multivitamin with minerals tablet 1 tablet, 1 tablet, Oral, Daily, Landri Dorsainvil E, NP, 1 tablet at 08/03/23 1338 .  nicotine  (NICODERM CQ  - dosed in mg/24 hours) patch 14 mg, 14 mg, Transdermal, Daily, Orvil Bland E, NP, 14 mg at 08/03/23 1337 .  nicotine  polacrilex (NICORETTE) gum 2 mg, 2 mg, Oral, Q4H while awake, Aniello Christopoulos E, NP  Current Outpatient Medications:  .  acetaminophen  (TYLENOL ) 325 MG tablet, Take 2 tablets (650 mg total) by mouth every 6 (six) hours as needed for mild pain (or Fever >/= 101)., Disp: , Rfl:  .  albuterol  (VENTOLIN  HFA) 108 (90 Base) MCG/ACT inhaler, Inhale 2 puffs into the lungs every 6 (six) hours as needed for wheezing or shortness of breath., Disp: 8 g, Rfl: 2 .  Docusate Sodium  (DSS) 100 MG CAPS, Take 1 capsule by mouth daily., Disp: , Rfl:  .  hydrOXYzine  (ATARAX ) 10 MG tablet, Take 1 tablet (10 mg total) by mouth 2 (two) times daily as needed for anxiety. (Patient taking differently: Take 15-20 mg by mouth 2 (two) times daily as needed for anxiety.), Disp: 60 tablet, Rfl: 1 .  ibuprofen  (ADVIL ) 800 MG tablet, Take 1 tablet (800 mg total) by mouth every 8 (eight) hours as needed., Disp: 30 tablet, Rfl: 0 .  levETIRAcetam  (KEPPRA ) 500 MG tablet, Take 1 tablet (500 mg total) by mouth in the morning, at noon, and at bedtime., Disp: , Rfl:  .  lidocaine  (LIDODERM ) 5 %, Place 1 patch onto the skin daily., Disp: , Rfl:  .   loratadine (CLARITIN) 10 MG tablet, Take 1 tablet by mouth daily., Disp: , Rfl:  .  mirtazapine  (REMERON ) 15 MG tablet, Take 15 mg by mouth at bedtime., Disp: , Rfl:  .  Multiple Vitamin (MULTI-VITAMIN) tablet, Take 1 tablet by mouth daily., Disp: , Rfl:  .  nicotine  (NICODERM CQ  - DOSED IN MG/24 HOURS) 14 mg/24hr patch, Place 1 patch onto the skin daily., Disp: , Rfl:  .  nicotine  polacrilex (NICORETTE) 2 MG gum, Place 2 mg inside cheek every 4 (four) hours while awake., Disp: , Rfl:  .  SUBOXONE 12-3 MG FILM, Place 1 Film under the tongue daily., Disp: , Rfl:  .  HYDROcodone -acetaminophen  (NORCO/VICODIN) 5-325 MG tablet, Take 1 tablet by mouth every 6 (six) hours as needed for moderate pain. (Patient not taking: Reported on 08/03/2023), Disp: 8 tablet, Rfl: 0 .  mirtazapine  (REMERON ) 7.5 MG tablet, Take 1 tablet (7.5 mg total) by mouth at bedtime. (Patient not taking: Reported on 08/03/2023), Disp: 30 tablet, Rfl: 1  Allergies: Allergies  Allergen Reactions  . Augmentin [Amoxicillin-Pot Clavulanate] Hives  . Lactose Intolerance (Gi) Nausea And Vomiting  . Biaxin [Clarithromycin] Rash  . Latex Rash    Doneen Fuelling, NP
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
## 2023-08-03 NOTE — ED Notes (Signed)
 Hospital meal provided, pt tolerated w/o complaints.  Waste discarded appropriately.

## 2023-08-03 NOTE — ED Notes (Signed)
 Meds given.

## 2023-08-03 NOTE — ED Notes (Signed)
 Pt. Transferred to BHU, room# 1 from main ed .Patient was screened by security before entering the unit. Received Report and Recommendations from [place], [place] . Pt. Oriented to unit including Q15 minute rounding as well as locked bathroom protocol, meal / snack schedule, and  the security cameras in place for their protection. Patient is A/O x 4, warm / dry.  Showing no acute signs of distress. Staff to monitor as ordered

## 2023-08-03 NOTE — ED Notes (Signed)
 Angela M., NP, who recommended pt. for continued observation and reassessment in the AM.

## 2023-08-03 NOTE — ED Notes (Signed)
 TTS and tele-psych provider at bedside for evaluation at this time.

## 2023-08-03 NOTE — ED Notes (Addendum)
 Nurse talked with the patient and she states that she drank too much alcohol and was eating those gummies, and that she has so much anxiety that she has lots of issues. Patient states she had appointment with psychiatrist tomorrow in [place] and needed to call to cancel it if she was going to still be here, but she would like to go home. Nurse let her know that she would keep her updated on any new information regarding her care when she found out anything;she also told nurse that she would like to get sober. Nurse did talk to there about sobriety and different programs that are available.

## 2023-08-03 NOTE — Progress Notes (Signed)
 Psychiatry team attempted to assess Gwendolyn Gonzales with the following outcomes as stated per TTS:  Pt was resting upon this writer's arrival. Pt's speech was loose and nonsensical. Pt's thought process was irrelevant throughout the assessment. Pt was unable to answer assessment questions appropriately. Pt was difficult to track and had disorganized behavior. Pt unable to explain why she'd presented to the hospital.   This patient needs a reassessment by day shift.

## 2023-08-03 NOTE — ED Provider Notes (Signed)
-----------------------------------------   8:08 PM on 08/03/2023 -----------------------------------------   Blood pressure 118/82, pulse (!) 106, temperature 98.9 F (37.2 C), temperature source Oral, resp. rate 15, height 5\' 2"  (1.575 m), weight 65.8 kg, last menstrual period 07/13/2023, SpO2 96%.  The patient is calm and cooperative at this time.  Patient accepted to [facility] and will be transferred tomorrow morning.   Kandee Orion, MD 08/03/23 2008

## 2023-08-03 NOTE — ED Notes (Signed)
 Nurse gave Alston Jerry RN report and she will be transferring to room 1.

## 2023-08-03 NOTE — ED Triage Notes (Signed)
 Patient ambulatory to triage with steady gait, without difficulty or distress noted, in custody of Product manager for [employer]; pt st "for government reasons"; pt denies HI or SI

## 2023-08-04 NOTE — ED Notes (Signed)
 EMTALA reviewed by charge RN

## 2023-08-04 NOTE — ED Notes (Signed)

## 2023-08-04 NOTE — ED Notes (Signed)
 Report given to Josephine Nicolas, RN  IVC/PATIENT BED AVAILABLE AFTER 9AM ON 08/04/23 Patient has been accepted to [facility].  Patient assigned to [facility] A-Unit Accepting physician is Dr. Berl Breed.  Call report to [phone].  Representative was Deon.

## 2023-08-04 NOTE — ED Provider Notes (Signed)
 Vitals:   08/03/23 0813 08/03/23 1833  BP: 110/69 118/82  Pulse: 100 (!) 106  Resp: 18 15  Temp: 98.2 F (36.8 C) 98.9 F (37.2 C)  SpO2: 97% 96%     Patient alert oriented. Clam, no distress.  Discussed with her and she is understanding agreeable with plan to transfer. Stable for transfer.    Iver Marker, MD 08/04/23 [phone]

## 2023-08-04 NOTE — ED Notes (Signed)
 Breakfast provided.

## 2023-08-04 NOTE — ED Notes (Signed)
 Called C com for sheriff's transport to [facility]  [phone]

## 2023-08-04 NOTE — ED Notes (Signed)
 IVC/PATIENT BED AVAILABLE AFTER 9AM ON 08/04/23 Patient has been accepted to [facility].  Patient assigned to [facility] A-Unit Accepting physician is Dr. Berl Breed.  Call report to [phone].  Representative was Deon.

## 2023-08-04 NOTE — ED Notes (Signed)
 Pt is A/Ox 3, Ms [place] was able to verbalize understanding to transfer to inpatient tx facility, and is agreeable. Report called to Fabio Holts, RN .  Pt left ambulatory via [facility].  All Belongings accounted for and given to [facility] for transport. Receiving facility notified on departure.

## 2023-08-11 ENCOUNTER — Telehealth: Payer: Self-pay | Admitting: Psychiatry

## 2023-08-11 ENCOUNTER — Telehealth: Payer: Self-pay

## 2023-08-11 ENCOUNTER — Encounter: Payer: Self-pay | Admitting: Psychiatry

## 2023-08-11 ENCOUNTER — Ambulatory Visit: Payer: MEDICAID | Admitting: Psychiatry

## 2023-08-11 DIAGNOSIS — F411 Generalized anxiety disorder: Secondary | ICD-10-CM

## 2023-08-11 NOTE — Telephone Encounter (Signed)
 Printed out a new letter with corrections.  Staff to mail it to patient.

## 2023-08-11 NOTE — Telephone Encounter (Signed)
 had to leave a message for her to call office back. when I called it went to a spanish.   Dr. Eappen has dismissed pt for missing appt. I will try again tomorrow.

## 2023-08-11 NOTE — Telephone Encounter (Signed)
 tried to call [facility] to make sure that patient was not still there but it just rings and rings and then cuts off.

## 2023-08-11 NOTE — Telephone Encounter (Signed)
 Unable to verify current medications since patient missed her appointment today.  Patient was recently admitted and hence will need a discharge summary prior to sending medication refills.

## 2023-08-12 ENCOUNTER — Telehealth: Payer: Self-pay | Admitting: Psychiatry

## 2023-08-12 NOTE — Telephone Encounter (Signed)
 Message left at phone number on file to call and schedule appointment that was missed due to hospitalization on 08-12-23. Also Mychart message sent to call the office

## 2023-08-13 ENCOUNTER — Telehealth: Payer: Self-pay | Admitting: Psychiatry

## 2023-08-13 NOTE — Telephone Encounter (Signed)
 Patient left phone message that her phone is broke and that is why she is not able to call back.

## 2023-08-16 ENCOUNTER — Telehealth: Payer: Self-pay | Admitting: Psychiatry

## 2023-08-16 NOTE — Telephone Encounter (Signed)
 I have reviewed  discharge records from [facility] dated 08/04/2023 - 08/11/2023.  Reason for admission-psychosis.  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 Patient also advised to continue buprenorphine  2 mg sublingual twice a day.  Staff has attempted to contact patient to call back to schedule her missed appointment on 08/11/2023-patient was discharged that day from the hospital and did not show up for her appointment.  Patient was initially dismissed since we had not received any notification from the hospital that she was admitted to regarding this.  However once discharge instructions were received, staff attempted to contact patient back to reschedule appointment.My chart message was also sent.

## 2023-08-18 ENCOUNTER — Ambulatory Visit: Payer: MEDICAID | Admitting: Professional Counselor

## 2023-09-02 ENCOUNTER — Ambulatory Visit (INDEPENDENT_AMBULATORY_CARE_PROVIDER_SITE_OTHER): Payer: MEDICAID | Admitting: Professional Counselor

## 2023-09-02 DIAGNOSIS — F411 Generalized anxiety disorder: Secondary | ICD-10-CM | POA: Diagnosis not present

## 2023-09-02 NOTE — Progress Notes (Unsigned)
  THERAPIST PROGRESS NOTE  Session Time: 3:00 PM - 3:45 PM   Participation Level: {BHH PARTICIPATION LEVEL:22264}  Behavioral Response: {Appearance:22683}{BHH LEVEL OF CONSCIOUSNESS:22305}{BHH MOOD:22306}  Type of Therapy: {CHL AMB BH Type of Therapy:21022741}  Treatment Goals addressed: ***  ProgressTowards Goals: {Progress Towards Goals:21014066}  Interventions: {CHL AMB BH Type of Intervention:21022753}  Summary: Gwendolyn Gonzales is a 38 y.o. female who presents with ***. Discuss hospitalization, situational stressors  Suicidal/Homicidal: {BHH YES OR NO:22294}{yes/no/with/without intent/plan:22693}  Therapist Response: Conducted session with . Began session with check-in/update since previous session. Utilized empathetic and reflective listening. Scheduled additional appointment and concluded session.  STOP/TIP skill, referral to PHP  Plan: Return again in *** weeks.  Diagnosis: No diagnosis found.  Collaboration of Care: Medication Management AEB chart review  Patient/Guardian was advised Release of Information must be obtained prior to any record release in order to collaborate their care with an outside provider. Patient/Guardian was advised if they have not already done so to contact the registration department to sign all necessary forms in order for us  to release information regarding their care.   Consent: Patient/Guardian gives verbal consent for treatment and assignment of benefits for services provided during this visit. Patient/Guardian expressed understanding and agreed to proceed.   Gwendolyn Gonzales, [facility] 09/02/2023

## 2023-09-07 NOTE — Congregational Nurse Program (Signed)
  Dept: [phone]   Congregational Nurse Program Note  Date of Encounter: 09/07/2023  Past Medical History: Past Medical History:  Diagnosis Date   Anxiety    Depression    History of abnormal cervical Pap smear    years ago   History of asthma    as child   History of chlamydia    History of urinary tract infection    Seizure (HCC)    07/2019    Encounter Details:  Community Questionnaire - 09/07/23 1436       Questionnaire   Ask client: Do you give verbal consent for me to treat you today? Yes    Student Assistance N/A    Location Patient Served  [facility]    Encounter Setting CN site    Population Status Unknown    Insurance Medicaid    Insurance/Financial Assistance Referral N/A    Medication N/A    Medical Provider Yes    Screening Referrals Made N/A    Medical Referrals Made N/A    Medical Appointment Completed N/A    CNP Interventions Advocate/Support    Screenings CN Performed Blood Pressure    ED Visit Averted N/A    Life-Saving Intervention Made N/A          Today's Vitals   09/07/23 1434  BP: (!) 140/90   There is no height or weight on file to calculate BMI.   Patient complaining of increasing anxiety and increasing BP concerns.  Patient out of BP medications, however patient said that she had a plan to pick up medications this afternoon from pharmacy.  Patient notes that she only pays $4 copay for meds and that she does not have problems paying for them.  Patient asked for BP cuff to monitor BP daily.  Patient counseled about pharmacy delivery service at [facility] and information given. Nurse taught deep breathing and 5-4-3-2-1 grounding technique to help with anxiety.

## 2023-09-13 ENCOUNTER — Ambulatory Visit (INDEPENDENT_AMBULATORY_CARE_PROVIDER_SITE_OTHER): Payer: MEDICAID | Admitting: Psychiatry

## 2023-09-13 ENCOUNTER — Encounter: Payer: Self-pay | Admitting: Psychiatry

## 2023-09-13 VITALS — BP 136/88 | HR 110 | Temp 98.2°F | Ht 62.0 in | Wt 147.6 lb

## 2023-09-13 DIAGNOSIS — F411 Generalized anxiety disorder: Secondary | ICD-10-CM | POA: Diagnosis not present

## 2023-09-13 DIAGNOSIS — F1491 Cocaine use, unspecified, in remission: Secondary | ICD-10-CM

## 2023-09-13 DIAGNOSIS — F0630 Mood disorder due to known physiological condition, unspecified: Secondary | ICD-10-CM | POA: Diagnosis not present

## 2023-09-13 DIAGNOSIS — G4700 Insomnia, unspecified: Secondary | ICD-10-CM

## 2023-09-13 DIAGNOSIS — Z87898 Personal history of other specified conditions: Secondary | ICD-10-CM | POA: Diagnosis not present

## 2023-09-13 DIAGNOSIS — F1511 Other stimulant abuse, in remission: Secondary | ICD-10-CM

## 2023-09-13 NOTE — Progress Notes (Unsigned)
 BH MD OP Progress Note  09/13/2023 3:59 PM Gwendolyn Gonzales  MRN:  [id number]  Chief Complaint:  Chief Complaint  Patient presents with   Follow-up   Anxiety   Depression   Medication Refill   Discussed the use of AI scribe software for clinical note transcription with the patient, who gave verbal consent to proceed.  History of Present Illness Gwendolyn Gonzales is a 38 year old Caucasian female, single, lives in [place], has a history of anxiety disorder, insomnia, recent diagnosis of bipolar disorder, opioid use disorder on MAT, presents for a follow-up appointment.  She was admitted to the hospital at [facility] on Aug 04, 2023, and discharged on Aug 11, 2023, due to a panic attack that occurred during a police visit at her residence. The panic attack was related to a family member staying with her, which led to police involvement. During her hospital stay, her medications were adjusted; hydroxyzine  and mirtazapine  were discontinued, and she was started on risperidone and trazodone. She notes feeling a significant improvement with risperidone.  Per review of discharge summary she was diagnosed with bipolar disorder, manic  with psychosis. She is currently taking risperidone 1 mg twice a day and trazodone. She was previously on hydroxyzine  and mirtazapine , which were discontinued during her hospital stay. She is also under the care of [facility] psychiatry for her mental health management.  She has a history of THC use disorder and opioid use disorder. She reports that the dispensary she was obtaining marijuana from has shut down, and she is no longer using THC. She is currently on buprenorphine  2 mg for opioid use disorder management.  She currently denies any concerns with her mood symptoms and reports current medications as beneficial.  She currently denies any suicidality, homicidality or perceptual disturbances.  She agrees to follow up with her current  psychiatrist at [facility] for further medication management and for continued manage of care.  She would like to continue care with Ms. Almarie Ligas, therapist here as well.      Visit Diagnosis:    ICD-10-CM   1. GAD (generalized anxiety disorder)  F41.1     2. Insomnia, unspecified type  G47.00     3. Mood disorder in conditions classified elsewhere  F06.30     4. History of alcohol use  Z87.898     5. History of cocaine use  F14.91       Past Psychiatric History: I have reviewed past psychiatric history from progress note on 04/19/2023.  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  Past Medical History:  Past Medical History:  Diagnosis Date   Anxiety    Depression    History of abnormal cervical Pap smear    years ago   History of asthma    as child   History of chlamydia    History of urinary tract infection    Seizure (HCC)    07/2019    Past Surgical History:  Procedure Laterality Date   CESAREAN SECTION     CESAREAN SECTION  15 May 2006   FLEXIBLE BRONCHOSCOPY W/ UPPER ENDOSCOPY     INNER EAR SURGERY      Family Psychiatric History: I have reviewed family psychiatric history from progress note on 04/19/2023.  Family History:  Family History  Problem Relation Age of Onset   Throat cancer Father  Lung cancer Father    Breast cancer Paternal Aunt    Ovarian cancer Paternal Aunt    Throat cancer Paternal Aunt    Lung cancer Paternal Aunt    Hypertension Mother    Anxiety disorder Mother    High Cholesterol Mother    Hypertension Maternal Grandmother    High Cholesterol Maternal Grandmother    Leukemia Maternal Grandmother    Diabetes Maternal Grandmother    Hypertension Maternal Grandfather    High Cholesterol Maternal Grandfather     Social History: I have reviewed social history from progress note on  04/19/2023. Social History   Socioeconomic History   Marital status: Single    Spouse name: Not on file   Number of children: 2   Years of education: Not on file   Highest education level: High school graduate  Occupational History   Not on file  Tobacco Use   Smoking status: Every Day    Current packs/day: 0.25    Average packs/day: 0.3 packs/day for 21.0 years (5.3 ttl pk-yrs)    Types: Cigarettes, E-cigarettes   Smokeless tobacco: Former   Tobacco comments:    Quit smoking cigarettes and now vaps   Vaping Use   Vaping status: Some Days   Substances: Nicotine   Substance and Sexual Activity   Alcohol use: Not Currently   Drug use: Not Currently    Types: Methamphetamines, Marijuana    Comment: currently denies   Sexual activity: Not Currently    Birth control/protection: None  Other Topics Concern   Not on file  Social History Narrative   Not on file   Social Drivers of Health   Financial Resource Strain: Medium Risk (06/01/2023)   Overall Financial Resource Strain (CARDIA)    Difficulty of Paying Living Expenses: Somewhat hard  Food Insecurity: Food Insecurity Present (09/07/2023)   Hunger Vital Sign    Worried About Running Out of Food in the Last Year: Sometimes true    Ran Out of Food in the Last Year: Sometimes true  Transportation Needs: Unmet Transportation Needs (09/07/2023)   PRAPARE - Transportation    Lack of Transportation (Medical): Yes    Lack of Transportation (Non-Medical): Yes  Physical Activity: Insufficiently Active (06/01/2023)   Exercise Vital Sign    Days of Exercise per Week: 7 days    Minutes of Exercise per Session: 10 min  Stress: Stress Concern Present (06/01/2023)   [employer] of Occupational Health - Occupational Stress Questionnaire    Feeling of Stress : To some extent  Social Connections: Socially Isolated (06/01/2023)   Social Connection and Isolation Panel    Frequency of Communication with Friends and Family: Never     Frequency of Social Gatherings with Friends and Family: Never    Attends Religious Services: 1 to 4 times per year    Active Member of [employer] or Organizations: No    Attends Banker Meetings: Never    Marital Status: Never married    Allergies:  Allergies  Allergen Reactions   Augmentin [Amoxicillin-Pot Clavulanate] Hives   Lactose Intolerance (Gi) Nausea And Vomiting   Biaxin [Clarithromycin] Rash   Latex Rash    Metabolic Disorder Labs: Lab Results  Component Value Date   HGBA1C 5.0 05/29/2016   MPG 97 05/29/2016   Lab Results  Component Value Date   PROLACTIN 34.9 (H) 05/29/2016   Lab Results  Component Value Date   CHOL 192 05/29/2016   TRIG 78 05/29/2016   HDL 50 05/29/2016  CHOLHDL 3.8 05/29/2016   VLDL 16 05/29/2016   LDLCALC 126 (H) 05/29/2016   Lab Results  Component Value Date   TSH 2.618 05/11/2023   TSH 3.280 05/29/2016    Therapeutic Level Labs: No results found for: LITHIUM No results found for: VALPROATE No results found for: CBMZ  Current Medications: Current Outpatient Medications  Medication Sig Dispense Refill   acetaminophen  (TYLENOL ) 325 MG tablet Take 2 tablets (650 mg total) by mouth every 6 (six) hours as needed for mild pain (or Fever >/= 101).     albuterol  (VENTOLIN  HFA) 108 (90 Base) MCG/ACT inhaler Inhale 2 puffs into the lungs every 6 (six) hours as needed for wheezing or shortness of breath. 8 g 2   cyclobenzaprine  (FLEXERIL ) 10 MG tablet Take 10 mg by mouth 2 (two) times daily as needed.     Docusate Sodium  (DSS) 100 MG CAPS Take 1 capsule by mouth daily.     HYDROcodone -acetaminophen  (NORCO/VICODIN) 5-325 MG tablet Take 1 tablet by mouth every 6 (six) hours as needed for moderate pain. 8 tablet 0   ibuprofen  (ADVIL ) 800 MG tablet Take 1 tablet (800 mg total) by mouth every 8 (eight) hours as needed. 30 tablet 0   levETIRAcetam  (KEPPRA ) 500 MG tablet Take 1 tablet (500 mg total) by mouth in the morning, at noon,  and at bedtime.     lidocaine  (LIDODERM ) 5 % Place 1 patch onto the skin daily.     loratadine (CLARITIN) 10 MG tablet Take 1 tablet by mouth daily.     Multiple Vitamin (MULTI-VITAMIN) tablet Take 1 tablet by mouth daily.     nicotine  polacrilex (NICORETTE ) 2 MG gum Place 2 mg inside cheek every 4 (four) hours while awake.     risperiDONE (RISPERDAL) 1 MG tablet Take 1 mg by mouth 2 (two) times daily.     SUBOXONE  12-3 MG FILM Place 1 Film under the tongue daily.     traZODone (DESYREL) 50 MG tablet Take 50 mg by mouth at bedtime as needed.     No current facility-administered medications for this visit.     Musculoskeletal: Strength & Muscle Tone: within normal limits Gait & Station: normal Patient leans: N/A  Psychiatric Specialty Exam: Review of Systems  Psychiatric/Behavioral:  Positive for sleep disturbance (Improving).     Blood pressure 136/88, pulse (!) 110, temperature 98.2 F (36.8 C), temperature source Temporal, height 5' 2 (1.575 m), weight 147 lb 9.6 oz (67 kg), last menstrual period 09/10/2023, SpO2 97%.Body mass index is 27 kg/m.  General Appearance: Fairly Groomed  Eye Contact:  Fair  Speech:  Clear and Coherent  Volume:  Normal  Mood:  Euthymic  Affect:  Appropriate  Thought Process:  Goal Directed and Descriptions of Associations: Intact  Orientation:  Full (Time, Place, and Person)  Thought Content: Logical   Suicidal Thoughts:  No  Homicidal Thoughts:  No  Memory:  Immediate;   Fair Recent;   Fair Remote;   Fair  Judgement:  Fair  Insight:  Fair  Psychomotor Activity:  Normal  Concentration:  Concentration: Fair and Attention Span: Fair  Recall:  [employer] of Knowledge: Fair  Language: Fair  Akathisia:  No  Handed:  Right  AIMS (if indicated): Denies any side effects to risperidone  Assets:  Communication Skills Desire for Improvement Housing Social Support Transportation  ADL's:  Intact  Cognition: WNL  Sleep:  Improving    Screenings: AIMS    Flowsheet Row Admission (Discharged) from  05/28/2016 in [facility] MEDICINE  AIMS Total Score 0   AUDIT    Flowsheet Row Admission (Discharged) from 05/28/2016 in [facility] INPATIENT BEHAVIORAL MEDICINE  Alcohol Use Disorder Identification Test Final Score (AUDIT) 3   GAD-7    Flowsheet Row Counselor from 06/01/2023 in [facility] Office Visit from 04/19/2023 in [facility]  Total GAD-7 Score 11 10   PHQ2-9    Flowsheet Row Counselor from 06/01/2023 in [facility] Office Visit from 04/19/2023 in [facility] Clinical Support from 09/19/2019 in [facility]  PHQ-2 Total Score 3 1 0  PHQ-9 Total Score 11 -- --   Flowsheet Row ED from 08/03/2023 in [facility] at [facility] Counselor from 06/01/2023 in [facility] Office Visit from 04/19/2023 in [facility]  C-SSRS RISK CATEGORY No Risk No Risk No Risk     Assessment and Plan: Reneisha Stilley Carles is a 38 year old Caucasian female with multiple psychiatric diagnoses but most recent inpatient behavioral health admission at [facility], and per review of discharge summary diagnosed with bipolar disorder, THC use disorder, presents for a follow-up appointment.  Patient currently however under the care of [facility] psychiatry and agrees to follow up with them for continuity of care.  No medication changes were made at this visit and patient agrees to continue medications as per most recent inpatient admission and follow-up with [facility] psychiatry for further management.  She would also like to continue psychotherapy sessions with our in-house psychotherapist in the meantime Ms. Almarie Ligas.  Generalized anxiety  disorder-improving Insomnia-improving Mood disorder unspecified-recently diagnosed with bipolar disorder-improving Continue risperidone 1 mg twice daily Continue trazodone 50 mg at bedtime as needed  I have reviewed discharge summary from old [facility].   Follow-up with current psychiatrist at [facility].   Consent: Patient/Guardian gives verbal consent for treatment and assignment of benefits for services provided during this visit. Patient/Guardian expressed understanding and agreed to proceed.  This note was generated in part or whole with voice recognition software. Voice recognition is usually quite accurate but there are transcription errors that can and very often do occur. I apologize for any typographical errors that were not detected and corrected.     Tesha Archambeau, MD 09/13/2023, 3:59 PM

## 2023-09-15 ENCOUNTER — Ambulatory Visit (INDEPENDENT_AMBULATORY_CARE_PROVIDER_SITE_OTHER): Payer: MEDICAID | Admitting: Professional Counselor

## 2023-09-15 DIAGNOSIS — F411 Generalized anxiety disorder: Secondary | ICD-10-CM | POA: Diagnosis not present

## 2023-09-15 DIAGNOSIS — F0630 Mood disorder due to known physiological condition, unspecified: Secondary | ICD-10-CM

## 2023-09-15 NOTE — Progress Notes (Signed)
 THERAPIST PROGRESS NOTE  Virtual Visit via Video Note  I connected with Aima Mcwhirt Smedley on 09/15/23 at  3:00 PM EDT by a video enabled telemedicine application and verified that I am speaking with the correct person using two identifiers.  Location: Patient: Home Provider: Office   I discussed the limitations of evaluation and management by telemedicine and the availability of in person appointments. The patient expressed understanding and agreed to proceed.   I discussed the assessment and treatment plan with the patient. The patient was provided an opportunity to ask questions and all were answered. The patient agreed with the plan and demonstrated an understanding of the instructions.   The patient was advised to call back or seek an in-person evaluation if the symptoms worsen or if the condition fails to improve as anticipated.  I provided 18 minutes of non-face-to-face time during this encounter. Almarie JONETTA Ligas, [facility]  Session Time: 3:03 PM - 3:21 PM   Participation Level: Active  Behavioral Response: Casual, Alert, Anxious  Type of Therapy: Individual Therapy  Treatment Goals addressed: Active Anxiety  LTG: Just more peace. Find more calmness. Finding a routine, a little more specific.                 Start:  07/06/23    Expected End:  07/04/24      STG: More of like a meditative type of focus. To improve control of mind AEB using daily mindfulness exercises over the next 90 days    STG: Less stress To improve stress management AEB identifying triggers and using coping mechanisms over the next 90 days.     STG: It (trauma) just makes you stay a step back from some things. It affects your small things. To reduce impact of trauma history AEB processing events and restructuring maladaptive patterns of thinking over the next 12 weeks.    ProgressTowards Goals: Progressing  Interventions: Motivational Interviewing and Supportive  Summary: Leyana Whidden is a 38 y.o. female who presents with a history of anxiety, depression, trauma, and polysubstance use. She appeared alert and oriented x5. She stated things are going better. She continues to use breathing exercises and has added bilateral stimulation music and it's been helpful. She is also trying to get on a routine and add activities like coloring and gardening to stay busy. [name] noted they had a minor hiccup with her roommate getting traffic tickets, but they have received assistance with utility bills. Bottcher will be continuing medication management with [facility] and remain at [facility] for outpatient therapy. She stated she might check into [facility] for group but feels like she is doing well at this time.   Therapist Response: Conducted session with [name]. Began session with check-in/update since previous session. Utilized empathetic and reflective listening. Used open-ended questions to facilitate discussion and summarized [name]'s thoughts/feelings. Myles Bottcher that [facility] referral was denied due to need for dual diagnosis and [facility] may be an option for that. Scheduled additional appointment and concluded session.   Suicidal/Homicidal: No  Plan: Return again in 4 weeks.  Diagnosis: GAD (generalized anxiety disorder)  Mood disorder in conditions classified elsewhere  Collaboration of Care: Medication Management AEB chart review  Patient/Guardian was advised Release of Information must be obtained prior to any record release in order to collaborate their care with an outside provider. Patient/Guardian was advised if they have not already done so to contact the registration department to sign all necessary forms in order for us  to release  information regarding their care.   Consent: Patient/Guardian gives verbal consent for treatment and assignment of benefits for services provided during this visit. Patient/Guardian expressed understanding and agreed to proceed.   Almarie JONETTA Ligas, [facility] 09/15/2023

## 2023-10-13 ENCOUNTER — Ambulatory Visit: Payer: MEDICAID | Admitting: Professional Counselor

## 2023-10-14 ENCOUNTER — Ambulatory Visit: Payer: MEDICAID | Admitting: Professional Counselor

## 2023-11-02 ENCOUNTER — Ambulatory Visit (INDEPENDENT_AMBULATORY_CARE_PROVIDER_SITE_OTHER): Payer: MEDICAID | Admitting: Professional Counselor

## 2023-11-02 DIAGNOSIS — F411 Generalized anxiety disorder: Secondary | ICD-10-CM | POA: Diagnosis not present

## 2023-11-02 DIAGNOSIS — F0630 Mood disorder due to known physiological condition, unspecified: Secondary | ICD-10-CM | POA: Diagnosis not present

## 2023-11-02 NOTE — Progress Notes (Signed)
  THERAPIST PROGRESS NOTE  Session Time: 2:00 PM - 2:35 PM   Participation Level: Active  Behavioral Response: Casual, Alert, Anxious  Type of Therapy: Individual Therapy  Treatment Goals addressed: Active Anxiety  LTG: Just more peace. Find more calmness. Finding a routine, a little more specific.  (Progressing)    Start:  07/06/23    Expected End:  07/04/24   Goal Note Reviewed 11/02/23 Yeah, staying level with all of that. I want to stay.  STG: More of like a meditative type of focus. To improve control of mind AEB using daily mindfulness exercises over the next 90 days (Progressing)   Goal Note Reviewed 11/02/23 I think I'm doing pretty good. I've been using the bilateral stimulation music and the breathing exercises and that's actually really helped.   STG: Less stress To improve stress management AEB identifying triggers and using coping mechanisms over the next 90 days.  (Progressing)   Goal Note Reviewed 11/02/23 I think everything's on track. Reports she was approved for disability and housing situation has improved.   STG: It (trauma) just makes you stay a step back from some things. It affects your small things. To reduce impact of trauma history AEB processing events and restructuring maladaptive patterns of thinking over the next 12 weeks.  (Not Progressing)   Goal Note Reviewed 11/02/23 I think getting all of this straightened out. I've already dealt with it. I think talking about it will bring it back up into an area will make it worse. Reports she is open to CPT to work through trauma as she still has triggers from it   ProgressTowards Goals: Progressing  Interventions: CBT, Motivational Interviewing, and Supportive  Summary: Farryn Linares is a 38 y.o. female who presents with a history of anxiety, depression, trauma, and polysubstance use. She appeared anxious but oriented x5. Lyle reported she was approved for disability and that has been a  stress reliever. She also reported the roommate left and that has reduced stress as well. [name] reviewed her treatment plan and noted progress on goals and areas for continued improvement. She shared conflicting thoughts on trauma but noted she would like to focus on this goal. She scored 25 on PCL screening. [name] expressed understanding about homework assignment.   Therapist Response: Conducted session with [name]. Began session with check-in/update since previous session. Utilized empathetic and reflective listening. Used open-ended questions to facilitate discussion and summarized [name]'s thoughts/feelings. Reviewed treatment plan with input from Stepney on current strengths, needs, and progress towards goals. Administered PCL screening. Provided psychoeducation on PTSD and stuck points. Explained homework assignment - impact statement. Reminded Lyle of importance to avoid avoiding and utilizing coping skills for triggers/symptoms. Scheduled additional appointment and concluded session.   Suicidal/Homicidal: No  Plan: Return again in 1 week.  Diagnosis: Mood disorder in conditions classified elsewhere  GAD (generalized anxiety disorder)  Collaboration of Care: Medication Management AEB chart review  Patient/Guardian was advised Release of Information must be obtained prior to any record release in order to collaborate their care with an outside provider. Patient/Guardian was advised if they have not already done so to contact the registration department to sign all necessary forms in order for us  to release information regarding their care.   Consent: Patient/Guardian gives verbal consent for treatment and assignment of benefits for services provided during this visit. Patient/Guardian expressed understanding and agreed to proceed.   Almarie JONETTA Ligas, [facility] 11/02/2023

## 2023-11-09 ENCOUNTER — Ambulatory Visit: Payer: MEDICAID | Admitting: Professional Counselor

## 2023-11-16 ENCOUNTER — Ambulatory Visit (INDEPENDENT_AMBULATORY_CARE_PROVIDER_SITE_OTHER): Payer: MEDICAID | Admitting: Professional Counselor

## 2023-11-16 DIAGNOSIS — F411 Generalized anxiety disorder: Secondary | ICD-10-CM | POA: Diagnosis not present

## 2023-11-16 DIAGNOSIS — F439 Reaction to severe stress, unspecified: Secondary | ICD-10-CM

## 2023-11-16 DIAGNOSIS — F0630 Mood disorder due to known physiological condition, unspecified: Secondary | ICD-10-CM | POA: Diagnosis not present

## 2023-11-16 NOTE — Progress Notes (Unsigned)
 THERAPIST PROGRESS NOTE  Session Time: 3:07 PM - 3:50 PM   Participation Level: Active  Behavioral Response: Disheveled, Alert, Anxious and Dysphoric  Type of Therapy: Individual Therapy  Treatment Goals addressed:  Active Anxiety  LTG: Just more peace. Find more calmness. Finding a routine, a little more specific.  (Progressing)                Start:  07/06/23    Expected End:  07/04/24   Goal Note Reviewed 11/02/23 Yeah, staying level with all of that. I want to stay.   STG: More of like a meditative type of focus. To improve control of mind AEB using daily mindfulness exercises over the next 90 days (Progressing)    Goal Note Reviewed 11/02/23 I think I'm doing pretty good. I've been using the bilateral stimulation music and the breathing exercises and that's actually really helped.    STG: Less stress To improve stress management AEB identifying triggers and using coping mechanisms over the next 90 days.  (Progressing)    Goal Note Reviewed 11/02/23 I think everything's on track. Reports she was approved for disability and housing situation has improved.    STG: It (trauma) just makes you stay a step back from some things. It affects your small things. To reduce impact of trauma history AEB processing events and restructuring maladaptive patterns of thinking over the next 12 weeks.  (Not Progressing)    Goal Note Reviewed 11/02/23 I think getting all of this straightened out. I've already dealt with it. I think talking about it will bring it back up into an area will make it worse. Reports she is open to CPT to work through trauma as she still has triggers from it   ProgressTowards Goals: Progressing  Interventions: CBT and Supportive  Summary: Gwendolyn Gonzales is a 38 y.o. female who presents with a history of anxiety, depression, trauma, and polysubstance use. She appeared disheveled but oriented x5. Gwendolyn Gonzales reported she was sick with a stomach bug this  week. She read her impact statement and engaged in discussion to identify more of the impact trauma had on her. Gwendolyn Gonzales wrote identified stuck points on stuck point log. She engaged in completing in [facility] worksheets and was receptive to Socratic questioning. She was able to re-frame some of her negative thinking. Gwendolyn Gonzales expressed understanding about homework assignment.   Therapist Response: Conducted session with Gwendolyn Gonzales. Began session with check-in/update since previous session. Utilized empathetic and reflective listening. Actively listened to Gwendolyn Gonzales's impact statement. Used open-ended questions to help identify additional thoughts on impact of trauma and assisted with identifying stuck points. Explained ABC worksheets and engaged Gwendolyn Gonzales with completing examples in session. Used Socratic questioning to help challenge negative thinking and assisted with re-framing thoughts. Scheduled additional appointment and concluded session.   Suicidal/Homicidal: No  Plan: Return again in 1 week.  Diagnosis: Mood disorder in conditions classified elsewhere  GAD (generalized anxiety disorder)  Trauma and stressor-related disorder  Collaboration of Care: Medication management AEB chart review  Patient/Guardian was advised Release of Information must be obtained prior to any record release in order to collaborate their care with an outside provider. Patient/Guardian was advised if they have not already done so to contact the registration department to sign all necessary forms in order for us  to release information regarding their care.   Consent: Patient/Guardian gives verbal consent for treatment and assignment of benefits for services provided during this visit. Patient/Guardian expressed understanding and agreed to proceed.   Gwendolyn Gonzales  Gwendolyn Gonzales, [facility] 11/16/2023

## 2023-11-25 ENCOUNTER — Ambulatory Visit (INDEPENDENT_AMBULATORY_CARE_PROVIDER_SITE_OTHER): Payer: MEDICAID | Admitting: Professional Counselor

## 2023-11-25 DIAGNOSIS — F411 Generalized anxiety disorder: Secondary | ICD-10-CM

## 2023-11-25 DIAGNOSIS — F439 Reaction to severe stress, unspecified: Secondary | ICD-10-CM | POA: Diagnosis not present

## 2023-11-25 DIAGNOSIS — F0630 Mood disorder due to known physiological condition, unspecified: Secondary | ICD-10-CM

## 2023-11-25 NOTE — Progress Notes (Signed)
  THERAPIST PROGRESS NOTE  Session Time: 2:01 PM - 2:50 PM  Participation Level: Active  Behavioral Response: Casual, Alert, Anxious  Type of Therapy: Individual Therapy  Treatment Goals addressed: Active Anxiety  LTG: Just more peace. Find more calmness. Finding a routine, a little more specific.  (Progressing)                Start:  07/06/23    Expected End:  07/04/24   Goal Note Reviewed 11/02/23 Yeah, staying level with all of that. I want to stay.   STG: More of like a meditative type of focus. To improve control of mind AEB using daily mindfulness exercises over the next 90 days (Progressing)    Goal Note Reviewed 11/02/23 I think I'm doing pretty good. I've been using the bilateral stimulation music and the breathing exercises and that's actually really helped.    STG: Less stress To improve stress management AEB identifying triggers and using coping mechanisms over the next 90 days.  (Progressing)    Goal Note Reviewed 11/02/23 I think everything's on track. Reports she was approved for disability and housing situation has improved.    STG: It (trauma) just makes you stay a step back from some things. It affects your small things. To reduce impact of trauma history AEB processing events and restructuring maladaptive patterns of thinking over the next 12 weeks.  (Not Progressing)    Goal Note Reviewed 11/02/23 I think getting all of this straightened out. I've already dealt with it. I think talking about it will bring it back up into an area will make it worse. Reports she is open to CPT to work through trauma as she still has triggers from it   ProgressTowards Goals: Progressing  Interventions: CBT and Supportive  Summary: Gwendolyn Gonzales is a 38 y.o. female who presents with a history of anxiety, depression, trauma, and polysubstance use. She appeared alert and oriented x5. She shared completed ABC worksheets. Gwendolyn Gonzales struggles with challenging her  thinking but appeared to understand as we further discussed events and her attached thoughts. She was receptive to how her mother's behavior impacts her and how her emotions help her understand her experience. Gwendolyn Gonzales engaged in completing an ABC worksheet focusing on trauma. She expressed understanding about homework assignment. Gwendolyn Gonzales scored 29 on PCL screening.   Therapist Response: Conducted session with [name]. Began session with check-in/update since previous session. Utilized empathetic and reflective listening. Used Socratic questioning to help challenge Gwendolyn Gonzales's stuck points/negative beliefs. Encouraged her to focus on her thoughts rather than events themselves. Engaged Gwendolyn Gonzales in completing an ABC worksheet around traumatic event. Assigned remaining worksheets for homework. Administered PCL screening. Scheduled additional appointment and concluded session.   Suicidal/Homicidal: No  Plan: Return again in 2 weeks.  Diagnosis: Trauma and stressor-related disorder  Mood disorder in conditions classified elsewhere  GAD (generalized anxiety disorder)  Collaboration of Care: Medication Management AEB chart review  Patient/Guardian was advised Release of Information must be obtained prior to any record release in order to collaborate their care with an outside provider. Patient/Guardian was advised if they have not already done so to contact the registration department to sign all necessary forms in order for us  to release information regarding their care.   Consent: Patient/Guardian gives verbal consent for treatment and assignment of benefits for services provided during this visit. Patient/Guardian expressed understanding and agreed to proceed.   Gwendolyn Gonzales, [facility] 11/25/2023

## 2023-12-08 ENCOUNTER — Ambulatory Visit (INDEPENDENT_AMBULATORY_CARE_PROVIDER_SITE_OTHER): Payer: MEDICAID | Admitting: Professional Counselor

## 2023-12-08 DIAGNOSIS — F0630 Mood disorder due to known physiological condition, unspecified: Secondary | ICD-10-CM | POA: Diagnosis not present

## 2023-12-08 DIAGNOSIS — F439 Reaction to severe stress, unspecified: Secondary | ICD-10-CM | POA: Diagnosis not present

## 2023-12-08 DIAGNOSIS — F411 Generalized anxiety disorder: Secondary | ICD-10-CM

## 2023-12-08 NOTE — Progress Notes (Signed)
  THERAPIST PROGRESS NOTE  Session Time: 3:40 PM - 4:24 PM   Participation Level: Active  Behavioral Response: Casual, Alert, Anxious  Type of Therapy: Individual Therapy  Treatment Goals addressed: Active Anxiety  LTG: Just more peace. Find more calmness. Finding a routine, a little more specific.  (Progressing)                Start:  07/06/23    Expected End:  07/04/24   Goal Note Reviewed 11/02/23 Yeah, staying level with all of that. I want to stay.   STG: More of like a meditative type of focus. To improve control of mind AEB using daily mindfulness exercises over the next 90 days (Progressing)    Goal Note Reviewed 11/02/23 I think I'm doing pretty good. I've been using the bilateral stimulation music and the breathing exercises and that's actually really helped.    STG: Less stress To improve stress management AEB identifying triggers and using coping mechanisms over the next 90 days.  (Progressing)    Goal Note Reviewed 11/02/23 I think everything's on track. Reports she was approved for disability and housing situation has improved.    STG: It (trauma) just makes you stay a step back from some things. It affects your small things. To reduce impact of trauma history AEB processing events and restructuring maladaptive patterns of thinking over the next 12 weeks.  (Not Progressing)    Goal Note Reviewed 11/02/23 I think getting all of this straightened out. I've already dealt with it. I think talking about it will bring it back up into an area will make it worse. Reports she is open to CPT to work through trauma as she still has triggers from it   ProgressTowards Goals: Progressing  Interventions: CBT and Supportive  Summary: Syerra Abdelrahman is a 38 y.o. female who presents with a history of anxiety, depression, trauma, and polysubstance use. She appeared alert and oriented x5. She apologized for being late; she thought the appointment was 4 PM. Lyle  shared her completed homework - ABC worksheets. She was receptive to discussion and Socratic questioning. She engaged in completing a challenging questions worksheet with the belief, It's my fault. She sometimes struggled to remain focused on the belief when answering the challenging questions but expressed understanding about homework assignment. [name] scored 24 on PCL screening.   Therapist Response: Conducted session with [name]. Began session with check-in/update since previous session. Utilized empathetic and reflective listening. Offered feedback and used Socratic questioning to help challenge negative thinking. Engaged [name] in completing challenging questions worksheet with stuck point. Reminded [name] to focus on the identified stuck point when answering the questions for homework. Administered PCL screening. Scheduled additional appointment and concluded session.   Suicidal/Homicidal: No  Plan: Return again in 2 weeks.  Diagnosis: Trauma and stressor-related disorder  Mood disorder in conditions classified elsewhere  GAD (generalized anxiety disorder)  Collaboration of Care: Medication Management AEB chart review  Patient/Guardian was advised Release of Information must be obtained prior to any record release in order to collaborate their care with an outside provider. Patient/Guardian was advised if they have not already done so to contact the registration department to sign all necessary forms in order for us  to release information regarding their care.   Consent: Patient/Guardian gives verbal consent for treatment and assignment of benefits for services provided during this visit. Patient/Guardian expressed understanding and agreed to proceed.   Almarie JONETTA Ligas, [facility] 12/08/2023

## 2023-12-22 ENCOUNTER — Ambulatory Visit: Payer: MEDICAID | Admitting: Professional Counselor

## 2023-12-22 DIAGNOSIS — F411 Generalized anxiety disorder: Secondary | ICD-10-CM

## 2023-12-22 DIAGNOSIS — F0630 Mood disorder due to known physiological condition, unspecified: Secondary | ICD-10-CM | POA: Diagnosis not present

## 2023-12-22 DIAGNOSIS — F439 Reaction to severe stress, unspecified: Secondary | ICD-10-CM | POA: Diagnosis not present

## 2023-12-22 NOTE — Progress Notes (Unsigned)
  THERAPIST PROGRESS NOTE  Session Time: 3:00 PM - 3:36 PM   Participation Level: Active  Behavioral Response: Casual, Alert, Anxious  Type of Therapy: Individual Therapy  Treatment Goals addressed: Active Anxiety  LTG: Just more peace. Find more calmness. Finding a routine, a little more specific.  (Progressing)                Start:  07/06/23    Expected End:  07/04/24   Goal Note Reviewed 11/02/23 Yeah, staying level with all of that. I want to stay.   STG: More of like a meditative type of focus. To improve control of mind AEB using daily mindfulness exercises over the next 90 days (Progressing)    Goal Note Reviewed 11/02/23 I think I'm doing pretty good. I've been using the bilateral stimulation music and the breathing exercises and that's actually really helped.    STG: Less stress To improve stress management AEB identifying triggers and using coping mechanisms over the next 90 days.  (Progressing)    Goal Note Reviewed 11/02/23 I think everything's on track. Reports she was approved for disability and housing situation has improved.    STG: It (trauma) just makes you stay a step back from some things. It affects your small things. To reduce impact of trauma history AEB processing events and restructuring maladaptive patterns of thinking over the next 12 weeks.  (Not Progressing)    Goal Note Reviewed 11/02/23 I think getting all of this straightened out. I've already dealt with it. I think talking about it will bring it back up into an area will make it worse. Reports she is open to CPT to work through trauma as she still has triggers from it   ProgressTowards Goals: Progressing  Interventions: CBT and Supportive  Summary: Sarahanne Novakowski is a 38 y.o. female who presents with a history of bipolar disorder, trauma, anxiety, and polysubstance use. She appeared alert and oriented x5. She stated things have been okay. [name] shared homework she completed -  challenging beliefs worksheets. She was receptive to feedback and took additional notes during session. She engaged in completing a problematic patterns of thinking worksheet. She expressed understanding about homework assignment.   Therapist Response: Conducted session with [name]. Began session with check-in/update since previous session. Utilized empathetic and reflective listening. Used Socratic questioning and provided feedback while reviewing homework - challenging beliefs worksheets. Engaged [name] in completing a problematic patterns of thinking worksheet. Assigned for homework. Scheduled additional appointment and concluded session.   Suicidal/Homicidal: No  Plan: Return again in 2 weeks.  Diagnosis: Trauma and stressor-related disorder  Mood disorder in conditions classified elsewhere  GAD (generalized anxiety disorder)  Collaboration of Care: Medication Management AEB chart review  Patient/Guardian was advised Release of Information must be obtained prior to any record release in order to collaborate their care with an outside provider. Patient/Guardian was advised if they have not already done so to contact the registration department to sign all necessary forms in order for us  to release information regarding their care.   Consent: Patient/Guardian gives verbal consent for treatment and assignment of benefits for services provided during this visit. Patient/Guardian expressed understanding and agreed to proceed.   Almarie JONETTA Ligas, [facility] 12/23/2023

## 2024-01-05 ENCOUNTER — Ambulatory Visit (INDEPENDENT_AMBULATORY_CARE_PROVIDER_SITE_OTHER): Payer: MEDICAID | Admitting: Professional Counselor

## 2024-01-05 DIAGNOSIS — F0630 Mood disorder due to known physiological condition, unspecified: Secondary | ICD-10-CM | POA: Diagnosis not present

## 2024-01-05 DIAGNOSIS — F439 Reaction to severe stress, unspecified: Secondary | ICD-10-CM | POA: Diagnosis not present

## 2024-01-05 DIAGNOSIS — F411 Generalized anxiety disorder: Secondary | ICD-10-CM

## 2024-01-05 NOTE — Progress Notes (Signed)
  THERAPIST PROGRESS NOTE  Session Time: 3:00 PM - 3:43 PM   Participation Level: Active  Behavioral Response: Casual, Alert, Anxious  Type of Therapy: Individual Therapy  Treatment Goals addressed:  Active Anxiety  LTG: Just more peace. Find more calmness. Finding a routine, a little more specific.  (Progressing)                Start:  07/06/23    Expected End:  07/04/24   Goal Note Reviewed 11/02/23 Yeah, staying level with all of that. I want to stay.   STG: More of like a meditative type of focus. To improve control of mind AEB using daily mindfulness exercises over the next 90 days (Progressing)    Goal Note Reviewed 11/02/23 I think I'm doing pretty good. I've been using the bilateral stimulation music and the breathing exercises and that's actually really helped.    STG: Less stress To improve stress management AEB identifying triggers and using coping mechanisms over the next 90 days.  (Progressing)    Goal Note Reviewed 11/02/23 I think everything's on track. Reports she was approved for disability and housing situation has improved.    STG: It (trauma) just makes you stay a step back from some things. It affects your small things. To reduce impact of trauma history AEB processing events and restructuring maladaptive patterns of thinking over the next 12 weeks.  (Not Progressing)    Goal Note Reviewed 11/02/23 I think getting all of this straightened out. I've already dealt with it. I think talking about it will bring it back up into an area will make it worse. Reports she is open to CPT to work through trauma as she still has triggers from it   ProgressTowards Goals: Progressing  Interventions: CBT and Supportive  Summary: Gwendolyn Gonzales is a 38 y.o. female who presents with a history of anxiety, mood disorder, trauma, and polysubstance use. She appeared alert and oriented x5. She reported they found a car to buy and are excited about that. Gwendolyn Gonzales noted  some issues with her money card but was able to open a bank account. She feels proud to have a legitimate bank account. Gwendolyn Gonzales shared completed homework - problematic patterns of thinking. She actively listened to challenging beliefs worksheets and took notes on how to complete. She scored 24 on PCL screening.   Therapist Response: Conducted session with Gwendolyn Gonzales. Began session with check-in/update since previous session. Utilized empathetic and reflective listening. Provided feedback on completed worksheets - problematic patterns of thinking. Explained challenging beliefs worksheets. Administered PCL screening. Scheduled additional appointment and concluded session.   Suicidal/Homicidal: No  Plan: Return again in 2 weeks.  Diagnosis: Trauma and stressor-related disorder  Mood disorder in conditions classified elsewhere  GAD (generalized anxiety disorder)  Collaboration of Care: Medication Management AEB chart review  Patient/Guardian was advised Release of Information must be obtained prior to any record release in order to collaborate their care with an outside provider. Patient/Guardian was advised if they have not already done so to contact the registration department to sign all necessary forms in order for us  to release information regarding their care.   Consent: Patient/Guardian gives verbal consent for treatment and assignment of benefits for services provided during this visit. Patient/Guardian expressed understanding and agreed to proceed.   Gwendolyn Gonzales, [facility] 01/05/2024

## 2024-01-19 ENCOUNTER — Ambulatory Visit (INDEPENDENT_AMBULATORY_CARE_PROVIDER_SITE_OTHER): Payer: MEDICAID | Admitting: Professional Counselor

## 2024-01-19 DIAGNOSIS — F0630 Mood disorder due to known physiological condition, unspecified: Secondary | ICD-10-CM

## 2024-01-19 DIAGNOSIS — F411 Generalized anxiety disorder: Secondary | ICD-10-CM

## 2024-01-19 DIAGNOSIS — F439 Reaction to severe stress, unspecified: Secondary | ICD-10-CM

## 2024-01-19 NOTE — Progress Notes (Unsigned)
  THERAPIST PROGRESS NOTE  Session Time: 3:03 PM - 3:56 PM   Participation Level: Active  Behavioral Response: Casual, Alert, Anxious  Type of Therapy: Individual Therapy  Treatment Goals addressed: Active Anxiety  LTG: Just more peace. Find more calmness. Finding a routine, a little more specific.  (Progressing)                Start:  07/06/23    Expected End:  07/04/24   Goal Note Reviewed 11/02/23 Yeah, staying level with all of that. I want to stay.   STG: More of like a meditative type of focus. To improve control of mind AEB using daily mindfulness exercises over the next 90 days (Progressing)    Goal Note Reviewed 11/02/23 I think I'm doing pretty good. I've been using the bilateral stimulation music and the breathing exercises and that's actually really helped.    STG: Less stress To improve stress management AEB identifying triggers and using coping mechanisms over the next 90 days.  (Progressing)    Goal Note Reviewed 11/02/23 I think everything's on track. Reports she was approved for disability and housing situation has improved.    STG: It (trauma) just makes you stay a step back from some things. It affects your small things. To reduce impact of trauma history AEB processing events and restructuring maladaptive patterns of thinking over the next 12 weeks.  (Not Progressing)    Goal Note Reviewed 11/02/23 I think getting all of this straightened out. I've already dealt with it. I think talking about it will bring it back up into an area will make it worse. Reports she is open to CPT to work through trauma as she still has triggers from it   ProgressTowards Goals: Progressing  Interventions: CBT and Supportive  Summary: Gwendolyn Gonzales is a 38 y.o. female who presents with a history of mood disorder, anxiety, and trauma. She appeared alert and oriented x5. She reported her daughter dressed up as Tinkerbell for Halloween. Gwendolyn Gonzales shared completed  homework of challenging beliefs worksheets. She was receptive to Socratic questioning and feedback to challenge stuck points. Gwendolyn Gonzales was happy to report some behaviors she tried as exposure exercises. She is proud of the progress she is making with changing her thinking and changing her behavior. She expressed understanding about next homework assignment.   Therapist Response: Conducted session with Gwendolyn Gonzales. Began session with check-in/update since previous session. Utilized empathetic and reflective listening. Used Socratic questions to challenge stuck points and offered other feedback on homework. Praised Gwendolyn Gonzales for taking initiative to practice exposure exercises. Explained next homework assignment - challenging beliefs worksheets on trust module. Scheduled additional appointment and concluded session.   Suicidal/Homicidal: No  Plan: Return again in 2 weeks.  Diagnosis: Trauma and stressor-related disorder  Mood disorder in conditions classified elsewhere  GAD (generalized anxiety disorder)  Collaboration of Care: Medication Management AEB chart review  Patient/Guardian was advised Release of Information must be obtained prior to any record release in order to collaborate their care with an outside provider. Patient/Guardian was advised if they have not already done so to contact the registration department to sign all necessary forms in order for us  to release information regarding their care.   Consent: Patient/Guardian gives verbal consent for treatment and assignment of benefits for services provided during this visit. Patient/Guardian expressed understanding and agreed to proceed.   Gwendolyn Gonzales, [facility] 01/19/2024

## 2024-02-02 ENCOUNTER — Ambulatory Visit (INDEPENDENT_AMBULATORY_CARE_PROVIDER_SITE_OTHER): Payer: MEDICAID | Admitting: Professional Counselor

## 2024-02-02 DIAGNOSIS — F439 Reaction to severe stress, unspecified: Secondary | ICD-10-CM | POA: Diagnosis not present

## 2024-02-02 DIAGNOSIS — F0630 Mood disorder due to known physiological condition, unspecified: Secondary | ICD-10-CM | POA: Diagnosis not present

## 2024-02-02 DIAGNOSIS — F411 Generalized anxiety disorder: Secondary | ICD-10-CM | POA: Diagnosis not present

## 2024-02-02 NOTE — Progress Notes (Signed)
  THERAPIST PROGRESS NOTE  Session Time: 3:05 PM - 3:44 PM   Participation Level: Active  Behavioral Response: Casual, Alert, Anxious  Type of Therapy: Individual Therapy  Treatment Goals addressed: Active Anxiety  LTG: Just more peace. Find more calmness. Finding a routine, a little more specific.  (Progressing)                Start:  07/06/23    Expected End:  07/04/24   Goal Note Reviewed 11/02/23 Yeah, staying level with all of that. I want to stay.   STG: More of like a meditative type of focus. To improve control of mind AEB using daily mindfulness exercises over the next 90 days (Progressing)    Goal Note Reviewed 11/02/23 I think I'm doing pretty good. I've been using the bilateral stimulation music and the breathing exercises and that's actually really helped.    STG: Less stress To improve stress management AEB identifying triggers and using coping mechanisms over the next 90 days.  (Progressing)    Goal Note Reviewed 11/02/23 I think everything's on track. Reports she was approved for disability and housing situation has improved.    STG: It (trauma) just makes you stay a step back from some things. It affects your small things. To reduce impact of trauma history AEB processing events and restructuring maladaptive patterns of thinking over the next 12 weeks.  (Not Progressing)    Goal Note Reviewed 11/02/23 I think getting all of this straightened out. I've already dealt with it. I think talking about it will bring it back up into an area will make it worse. Reports she is open to CPT to work through trauma as she still has triggers from it   ProgressTowards Goals: Progressing  Interventions: CBT and Supportive  Summary: Averi Kilty is a 38 y.o. female who presents with a history of anxiety, mood disorder, and trauma. She appeared alert and oriented x5. She reported she had a hard time with the homework, however once she shared, it was mostly  completed accurately. She was receptive to focusing on stuck points vs events. [name] reviewed safety module and noted stuck points on safety. She will complete challenging beliefs worksheets on these stuck points for homework.   Therapist Response: Conducted session with [name]. Began session with check-in/update since previous session. Utilized empathetic and reflective listening. Used Socratic questioning to help challenge negative thoughts. Provided feedback on homework - challenging beliefs worksheets. Reminded [name] to focus on the stuck point vs the situation. Reviewed safety module and assigned challenging worksheets on safety stuck points. Scheduled additional appointment and concluded session.   Suicidal/Homicidal: No  Plan: Return again in 2 weeks.  Diagnosis: Trauma and stressor-related disorder  Mood disorder in conditions classified elsewhere  GAD (generalized anxiety disorder)  Collaboration of Care: Medication Management AEB chart review  Patient/Guardian was advised Release of Information must be obtained prior to any record release in order to collaborate their care with an outside provider. Patient/Guardian was advised if they have not already done so to contact the registration department to sign all necessary forms in order for us  to release information regarding their care.   Consent: Patient/Guardian gives verbal consent for treatment and assignment of benefits for services provided during this visit. Patient/Guardian expressed understanding and agreed to proceed.   Almarie JONETTA Ligas, [facility] 02/02/2024

## 2024-02-16 ENCOUNTER — Ambulatory Visit: Payer: MEDICAID | Admitting: Professional Counselor

## 2024-03-01 ENCOUNTER — Ambulatory Visit: Payer: MEDICAID | Admitting: Professional Counselor

## 2024-03-01 DIAGNOSIS — F439 Reaction to severe stress, unspecified: Secondary | ICD-10-CM | POA: Diagnosis not present

## 2024-03-01 DIAGNOSIS — F0630 Mood disorder due to known physiological condition, unspecified: Secondary | ICD-10-CM

## 2024-03-01 DIAGNOSIS — F411 Generalized anxiety disorder: Secondary | ICD-10-CM

## 2024-03-01 NOTE — Progress Notes (Unsigned)
°  THERAPIST PROGRESS NOTE  Session Time: 4:05 PM - 4:36 PM   Participation Level: Active  Behavioral Response: Casual, Alert, Anxious  Type of Therapy: Individual Therapy  Treatment Goals addressed: Active Anxiety  LTG: Just more peace. Find more calmness. Finding a routine, a little more specific.  (Progressing)                Start:  07/06/23    Expected End:  07/04/24   Goal Note Reviewed 11/02/23 Yeah, staying level with all of that. I want to stay.   STG: More of like a meditative type of focus. To improve control of mind AEB using daily mindfulness exercises over the next 90 days (Progressing)    Goal Note Reviewed 11/02/23 I think I'm doing pretty good. I've been using the bilateral stimulation music and the breathing exercises and that's actually really helped.    STG: Less stress To improve stress management AEB identifying triggers and using coping mechanisms over the next 90 days.  (Progressing)    Goal Note Reviewed 11/02/23 I think everything's on track. Reports she was approved for disability and housing situation has improved.    STG: It (trauma) just makes you stay a step back from some things. It affects your small things. To reduce impact of trauma history AEB processing events and restructuring maladaptive patterns of thinking over the next 12 weeks.  (Not Progressing)    Goal Note Reviewed 11/02/23 I think getting all of this straightened out. I've already dealt with it. I think talking about it will bring it back up into an area will make it worse. Reports she is open to CPT to work through trauma as she still has triggers from it   ProgressTowards Goals: Progressing  Interventions: CBT and Supportive  Summary: Gwendolyn Gonzales is a 38 y.o. female who presents with a history of anxiety, mood disorder, and trauma. She appeared flustered but oriented x5. She reported she has been sick lately. Gwendolyn Gonzales shared homework she completed on safety stuck  points. She was receptive to feedback from conservation officer, nature. Gwendolyn Gonzales expressed understanding about trust module and homework assignment. She scored 24 on PCL screening.   Therapist Response: Conducted session with [name]. Began session with check-in/update since previous session. Utilized empathetic and reflective listening. Used Socratic questioning to challenge stuck points and offered feedback about homework. Explained trust module and trust star exercise. Assigned for homework. Scheduled additional appointment and concluded session.   Suicidal/Homicidal: No  Plan: Return again in 2 weeks.  Diagnosis: Trauma and stressor-related disorder  Mood disorder in conditions classified elsewhere  GAD (generalized anxiety disorder)  Collaboration of Care: Medication Management AEB chart review  Patient/Guardian was advised Release of Information must be obtained prior to any record release in order to collaborate their care with an outside provider. Patient/Guardian was advised if they have not already done so to contact the registration department to sign all necessary forms in order for us  to release information regarding their care.   Consent: Patient/Guardian gives verbal consent for treatment and assignment of benefits for services provided during this visit. Patient/Guardian expressed understanding and agreed to proceed.   Gwendolyn Gonzales, [facility] 03/01/2024

## 2024-03-14 ENCOUNTER — Ambulatory Visit (INDEPENDENT_AMBULATORY_CARE_PROVIDER_SITE_OTHER): Payer: MEDICAID | Admitting: Professional Counselor

## 2024-03-14 DIAGNOSIS — F439 Reaction to severe stress, unspecified: Secondary | ICD-10-CM

## 2024-03-14 NOTE — Progress Notes (Unsigned)
" °  THERAPIST PROGRESS NOTE  Session Time: 1:00 PM - 1:41 PM   Participation Level: Active  Behavioral Response: Casual, Alert, Anxious  Type of Therapy: Individual Therapy  Treatment Goals addressed: Active Anxiety  LTG: Just more peace. Find more calmness. Finding a routine, a little more specific.  (Progressing)                Start:  07/06/23    Expected End:  07/04/24   Goal Note Reviewed 11/02/23 Yeah, staying level with all of that. I want to stay.   STG: More of like a meditative type of focus. To improve control of mind AEB using daily mindfulness exercises over the next 90 days (Progressing)    Goal Note Reviewed 11/02/23 I think I'm doing pretty good. I've been using the bilateral stimulation music and the breathing exercises and that's actually really helped.    STG: Less stress To improve stress management AEB identifying triggers and using coping mechanisms over the next 90 days.  (Progressing)    Goal Note Reviewed 11/02/23 I think everything's on track. Reports she was approved for disability and housing situation has improved.    STG: It (trauma) just makes you stay a step back from some things. It affects your small things. To reduce impact of trauma history AEB processing events and restructuring maladaptive patterns of thinking over the next 12 weeks.  (Not Progressing)    Goal Note Reviewed 11/02/23 I think getting all of this straightened out. I've already dealt with it. I think talking about it will bring it back up into an area will make it worse. Reports she is open to CPT to work through trauma as she still has triggers from it   ProgressTowards Goals: Progressing  Interventions: CBT and Supportive  Summary: Gwendolyn Gonzales is a 38 y.o. female who presents with a history of anxiety, mood disorder, and trauma. She appeared alert and oriented x5. She stated they had a really good Christmas and she enjoyed spending time with her partner's  extended family. Gwendolyn Gonzales shared trust star she completed. She had not completed challenging beliefs worksheets, but completed a couple in session. She will complete the remaining power/control stuck points identified in session.   Therapist Response: Conducted session with [name]. Began session with check-in/update since previous session. Utilized empathetic and reflective listening. Used open-ended questions to facilitate discussion and summarized Gwendolyn Gonzales's thoughts/feelings. Completed challenging beliefs worksheets in session. Identified additional stuck points around power/control module. Assigned for homework. Scheduled additional appointment and concluded session.   Suicidal/Homicidal: No  Plan: Return again in 2 weeks.  Diagnosis: Trauma and stressor-related disorder  Collaboration of Care: Medication Management AEB chart review  Patient/Guardian was advised Release of Information must be obtained prior to any record release in order to collaborate their care with an outside provider. Patient/Guardian was advised if they have not already done so to contact the registration department to sign all necessary forms in order for us  to release information regarding their care.   Consent: Patient/Guardian gives verbal consent for treatment and assignment of benefits for services provided during this visit. Patient/Guardian expressed understanding and agreed to proceed.   Gwendolyn Gonzales, [facility] 03/14/2024  "

## 2024-03-30 ENCOUNTER — Ambulatory Visit: Payer: MEDICAID | Admitting: Professional Counselor

## 2024-04-04 ENCOUNTER — Ambulatory Visit: Payer: MEDICAID | Admitting: Professional Counselor

## 2024-04-13 ENCOUNTER — Ambulatory Visit: Payer: MEDICAID | Admitting: Professional Counselor

## 2024-04-27 ENCOUNTER — Ambulatory Visit: Payer: MEDICAID | Admitting: Professional Counselor

## 2024-05-10 ENCOUNTER — Ambulatory Visit: Payer: MEDICAID | Admitting: Professional Counselor
